# Patient Record
Sex: Female | Born: 1980 | Race: White | Hispanic: No | Marital: Married | Smoking: Never smoker
Health system: Southern US, Community
[De-identification: ages and names within clinical notes are randomized; demographics above are authoritative.]

## PROBLEM LIST (undated history)

## (undated) ENCOUNTER — Inpatient Hospital Stay (HOSPITAL_COMMUNITY): Payer: Self-pay

## (undated) DIAGNOSIS — F419 Anxiety disorder, unspecified: Secondary | ICD-10-CM

## (undated) DIAGNOSIS — D649 Anemia, unspecified: Secondary | ICD-10-CM

## (undated) DIAGNOSIS — Z9851 Tubal ligation status: Secondary | ICD-10-CM

## (undated) DIAGNOSIS — T4145XA Adverse effect of unspecified anesthetic, initial encounter: Secondary | ICD-10-CM

## (undated) DIAGNOSIS — Z98891 History of uterine scar from previous surgery: Secondary | ICD-10-CM

## (undated) DIAGNOSIS — R87629 Unspecified abnormal cytological findings in specimens from vagina: Secondary | ICD-10-CM

## (undated) DIAGNOSIS — T8859XA Other complications of anesthesia, initial encounter: Secondary | ICD-10-CM

## (undated) HISTORY — PX: CERVICAL BIOPSY  W/ LOOP ELECTRODE EXCISION: SUR135

## (undated) HISTORY — PX: THERAPEUTIC ABORTION: SHX798

---

## 2013-02-12 ENCOUNTER — Emergency Department (HOSPITAL_COMMUNITY)
Admission: EM | Admit: 2013-02-12 | Discharge: 2013-02-12 | Disposition: A | Discharge status: Home / Self Care | Payer: Medicaid Other | Attending: Emergency Medicine | Admitting: Emergency Medicine

## 2013-02-12 ENCOUNTER — Emergency Department (HOSPITAL_COMMUNITY): Payer: Medicaid Other

## 2013-02-12 ENCOUNTER — Encounter (HOSPITAL_COMMUNITY): Payer: Self-pay | Admitting: *Deleted

## 2013-02-12 DIAGNOSIS — T1490XA Injury, unspecified, initial encounter: Secondary | ICD-10-CM

## 2013-02-12 DIAGNOSIS — S8990XA Unspecified injury of unspecified lower leg, initial encounter: Secondary | ICD-10-CM | POA: Insufficient documentation

## 2013-02-12 DIAGNOSIS — S96912A Strain of unspecified muscle and tendon at ankle and foot level, left foot, initial encounter: Secondary | ICD-10-CM

## 2013-02-12 DIAGNOSIS — Y9301 Activity, walking, marching and hiking: Secondary | ICD-10-CM | POA: Insufficient documentation

## 2013-02-12 DIAGNOSIS — W108XXA Fall (on) (from) other stairs and steps, initial encounter: Secondary | ICD-10-CM | POA: Insufficient documentation

## 2013-02-12 DIAGNOSIS — Y929 Unspecified place or not applicable: Secondary | ICD-10-CM | POA: Insufficient documentation

## 2013-02-12 DIAGNOSIS — Z880 Allergy status to penicillin: Secondary | ICD-10-CM | POA: Insufficient documentation

## 2013-02-12 DIAGNOSIS — S93609A Unspecified sprain of unspecified foot, initial encounter: Secondary | ICD-10-CM | POA: Insufficient documentation

## 2013-02-12 MED ORDER — KETOROLAC TROMETHAMINE 60 MG/2ML IM SOLN
60.0000 mg | Freq: Once | INTRAMUSCULAR | Status: AC
  Administered 2013-02-12: 60 mg via INTRAMUSCULAR
  Filled 2013-02-12: qty 2

## 2013-02-12 MED ORDER — OXYCODONE-ACETAMINOPHEN 5-325 MG PO TABS: 2.0000 | ORAL_TABLET | Freq: Four times a day (QID) | ORAL | Status: DC | PRN

## 2013-02-12 NOTE — ED Provider Notes (Signed)
CSN: [id number]     Arrival date & time 02/12/13  1036 History   First MD Initiated Contact with Patient 02/12/13 1111     Chief Complaint  Patient presents with  . Foot Injury   (Consider location/radiation/quality/duration/timing/severity/associated sxs/prior Treatment) Patient is a 32 y.o. female presenting with lower extremity pain. The history is provided by the patient. No language interpreter was used.  Foot Pain This is a new problem. The current episode started today. Pertinent negatives include no numbness or weakness. The symptoms are aggravated by standing. She has tried nothing for the symptoms.   Pt is a 32 year old female who presents s/p fall with left foot and toe pain. She reports a recent muscle strain of her lower back. She was walking down wooden stairs outside today when her right leg felt weak, "like it had a cramp". She reports that she fell down 5-7 stairs and is now having left foot pain. She has some soreness in her right leg and foot but is able to bear weight on the right side. She reports her pain to be concentrated locally to her left foot and great toe. She has an abrasion on the top of her foot. She has not tried ice, elevation, OTC meds or any treatments at home.   History reviewed. No pertinent past medical history. Past Surgical History  Procedure Laterality Date  . Cesarean section    . Cervical biopsy  w/ loop electrode excision     No family history on file. History  Substance Use Topics  . Smoking status: Never Smoker   . Smokeless tobacco: Not on file  . Alcohol Use: No   OB History   Grav Para Term Preterm Abortions TAB SAB Ect Mult Living                 Review of Systems  Musculoskeletal:       Left foot and great toe pain. Abrasion on dorsum of foot.   Neurological: Negative for weakness and numbness.  All other systems reviewed and are negative.    Allergies  Amoxicillin; Erythromycin; and Penicillins  Home Medications  No  current outpatient prescriptions on file. BP 115/74  Pulse 70  Temp(Src) 98.1 F (36.7 C) (Oral)  Resp 18  Ht 5\' 1"  (1.549 m)  Wt 163 lb (73.936 kg)  BMI 30.81 kg/m2  SpO2 99%  LMP 02/09/2013 Physical Exam  Vitals reviewed. Constitutional: She is oriented to person, place, and time. She appears well-developed and well-nourished. No distress.  HENT:  Head: Normocephalic and atraumatic.  Eyes: Pupils are equal, round, and reactive to light.  Neck: Normal range of motion.  Cardiovascular: Normal rate, regular rhythm, normal heart sounds and intact distal pulses.   Pulmonary/Chest: Effort normal and breath sounds normal.  Musculoskeletal:       Left ankle: Normal.       Feet:  Left foot and great toe pain. Swelling to left great toe and limited ROM. Abrasion to dorsum of foot. Ankle stable, without swelling. Good sensation distally, brisk capillary refill, no numbness or tingling.   Neurological: She is alert and oriented to person, place, and time.  Skin: Skin is warm and dry.  Psychiatric: She has a normal mood and affect. Her behavior is normal. Judgment and thought content normal.    ED Course  Procedures (including critical care time) Labs Review Labs Reviewed - No data to display Imaging Review No results found.  MDM   1. Strain of  foot, left, initial encounter   2. Soft tissue injury     X-ray results, dorsal soft tissue swelling. No acute fracture. Good sensation and strength, limited ROM due to pain and swelling. No neurovascular compromise. 2+ distal pulses, brisk capillary refill. RICE instructions given.     Irish Elders, NP 02/12/13 1327

## 2013-02-12 NOTE — Progress Notes (Signed)
Orthopedic Tech Progress Note Patient Details:  Annette Hutchinson [birth year] [id number]  Ortho Devices Type of Ortho Device: Ace wrap;Crutches Ortho Device/Splint Location: Left LE Ortho Device/Splint Interventions: Application   Asia R Thompson 02/12/2013, 2:08 PM

## 2013-02-12 NOTE — ED Notes (Signed)
Pt's R leg buckled and she fell down stairs.  Denies loc and is not on anticoagulants.  C/o L foot pain, esp L great toe. Also c/o pain to R foot, though minimal.  Pt denies abuse.

## 2013-02-14 NOTE — ED Provider Notes (Signed)
Medical screening examination/treatment/procedure(s) were performed by non-physician practitioner and as supervising physician I was immediately available for consultation/collaboration.    Roddie Riegler David Lonza Shimabukuro, MD 02/14/13 0924 

## 2013-03-26 ENCOUNTER — Emergency Department (HOSPITAL_COMMUNITY): Payer: Medicaid Other

## 2013-03-26 ENCOUNTER — Encounter (HOSPITAL_COMMUNITY): Payer: Self-pay | Admitting: Emergency Medicine

## 2013-03-26 ENCOUNTER — Emergency Department (HOSPITAL_COMMUNITY)
Admission: EM | Admit: 2013-03-26 | Discharge: 2013-03-26 | Disposition: A | Discharge status: Home / Self Care | Payer: Medicaid Other | Attending: Emergency Medicine | Admitting: Emergency Medicine

## 2013-03-26 DIAGNOSIS — M79609 Pain in unspecified limb: Secondary | ICD-10-CM

## 2013-03-26 DIAGNOSIS — R0789 Other chest pain: Secondary | ICD-10-CM | POA: Insufficient documentation

## 2013-03-26 DIAGNOSIS — M79604 Pain in right leg: Secondary | ICD-10-CM

## 2013-03-26 DIAGNOSIS — Z880 Allergy status to penicillin: Secondary | ICD-10-CM | POA: Insufficient documentation

## 2013-03-26 DIAGNOSIS — R079 Chest pain, unspecified: Secondary | ICD-10-CM

## 2013-03-26 LAB — CBC
HCT: 39.4 % (ref 36.0–46.0)
Hemoglobin: 13.8 g/dL (ref 12.0–15.0)
MCH: 33 pg (ref 26.0–34.0)
MCHC: 35 g/dL (ref 30.0–36.0)
Platelets: 280 10*3/uL (ref 150–400)

## 2013-03-26 LAB — COMPREHENSIVE METABOLIC PANEL
ALT: 15 U/L (ref 0–35)
BUN: 10 mg/dL (ref 6–23)
Calcium: 9.4 mg/dL (ref 8.4–10.5)
GFR calc Af Amer: >90 mL/min (ref >90)
Glucose, Bld: 87 mg/dL (ref 70–99)
Sodium: 138 mEq/L (ref 135–145)
Total Protein: 7.6 g/dL (ref 6.0–8.3)

## 2013-03-26 LAB — POCT I-STAT TROPONIN I: Troponin i, poc: 0 ng/mL (ref 0.00–0.08)

## 2013-03-26 NOTE — Progress Notes (Signed)
VASCULAR LAB PRELIMINARY  PRELIMINARY  PRELIMINARY  PRELIMINARY  Right lower extremity venous duplex completed.    Preliminary report:  Right:  No evidence of DVT, superficial thrombosis, or Baker's cyst.  Deo Mehringer, RVT 03/26/2013, 6:14 PM

## 2013-03-26 NOTE — ED Notes (Signed)
Chest burning started last week  Comes and goes pain came back yesterday and then it came back today and she is weak in her arms  Fell down flight off stairs ~5 weeks ago ?  Any relation

## 2013-03-26 NOTE — ED Provider Notes (Signed)
CSN: [id number]     Arrival date & time 03/26/13  1429 History   First MD Initiated Contact with Patient 03/26/13 1648     Chief Complaint  Patient presents with  . Chest Pain   (Consider location/radiation/quality/duration/timing/severity/associated sxs/prior Treatment) HPI Comments: Arien Morine is a 32 y.o. Female who presents for evaluation of chest discomfort. The symptoms have been on and off for several days. She has had 3 episodes. The discomfort is both a "hot and cold feeling. Today, it started insidiously, at 10 AM and has persisted without stopping. Today, the discomfort waxes and wanes between 3 and 8/10. She does not know of any aggravating or alleviating factors. She has not taken anything for the discomfort. She wonders if it "is my stress". Her husband works out of town and frequently is away during the week. Her young child is changing daycare facilities this week. She has also had right leg pain for 3 months that she feels is due to a muscle strain at the gym. She has been attempting to stretch it out, but cannot eliminate pain. The pain started in her right low back and has now migrated to her right posterior thigh and right lateral knee. She denies swelling of the legs. She denies shortness of breath, diaphoresis, nausea, vomiting, weakness, or dizziness. There are no other known modifying factors.  Patient is a 32 y.o. female presenting with chest pain. The history is provided by the patient.  Chest Pain   History reviewed. No pertinent past medical history. Past Surgical History  Procedure Laterality Date  . Cesarean section    . Cervical biopsy  w/ loop electrode excision     No family history on file. History  Substance Use Topics  . Smoking status: Never Smoker   . Smokeless tobacco: Not on file  . Alcohol Use: No   OB History   Grav Para Term Preterm Abortions TAB SAB Ect Mult Living                 Review of Systems  Cardiovascular: Positive for chest  pain.  All other systems reviewed and are negative.    Allergies  Amoxicillin; Erythromycin; and Penicillins  Home Medications  No current outpatient prescriptions on file. BP 108/60  Pulse 66  Temp(Src) 97.9 F (36.6 C)  Resp 20  Wt 166 lb (75.297 kg)  SpO2 100%  LMP 03/14/2013 Physical Exam  Nursing note and vitals reviewed. Constitutional: She is oriented to person, place, and time. She appears well-developed and well-nourished.  HENT:  Head: Normocephalic and atraumatic.  Eyes: Conjunctivae and EOM are normal. Pupils are equal, round, and reactive to light.  Neck: Normal range of motion and phonation normal. Neck supple.  Cardiovascular: Normal rate, regular rhythm and intact distal pulses.   Pulmonary/Chest: Effort normal and breath sounds normal. She exhibits no tenderness.  Abdominal: Soft. She exhibits no distension. There is no tenderness. There is no guarding.  Musculoskeletal: Normal range of motion.  Right leg, diffusely tender in posterior calf, and right lateral knee without deformity, or abnormal range of motion. Back is without tenderness in the lumbar area and she demonstrates normal range of motion of the lumbar spine. She is neurovascular intact distally in the right foot.  Neurological: She is alert and oriented to person, place, and time. She exhibits normal muscle tone.  Skin: Skin is warm and dry.  Psychiatric: Her behavior is normal. Judgment and thought content normal.  Anxious. She frequently scolds her young  child, who is in the room with her, in an aggressive fashion.    ED Course  Procedures (including critical care time)  Duplex ordered it to evaluate for DVT. Study is negative.  Will not image for PE, she is PERC negative.     Labs Review Labs Reviewed  CBC  COMPREHENSIVE METABOLIC PANEL  POCT I-STAT TROPONIN I   Imaging Review Dg Chest 2 View  03/26/2013   CLINICAL DATA:  Chest pain  EXAM: CHEST  2 VIEW  COMPARISON:  None.   FINDINGS: The lungs are clear. Heart size and pulmonary vascularity are normal. No adenopathy. No pneumothorax. No bone lesions. There is mild lower thoracic levoscoliosis.  IMPRESSION: No edema or consolidation.   Electronically Signed   By: Bretta Bang M.D.   On: 03/26/2013 15:42    EKG Interpretation     Ventricular Rate:  88 PR Interval:  118 QRS Duration: 86 QT Interval:  358 QTC Calculation: 433 R Axis:   77 Text Interpretation:  Normal sinus rhythm Nonspecific T wave abnormality Abnormal ECG No old tracing to compare            MDM   1. Chest pain, unspecified   2. Leg pain, right    Nonspecific chest pain is very atypical for coronary artery disease in a very low risk patient. This most likely represents stress related musculoskeletal pain. There is no evidence for coronary, pulmonary, metabolic or infectious processes. Unassociated back and leg pain are likely from a muscular or possibly sciatica source. She is stable for discharge with outpatient management.   Nursing Notes Reviewed/ Care Coordinated, and agree without changes. Applicable Imaging Reviewed.  Interpretation of Laboratory Data incorporated into ED treatment   Plan: Home Medications- Advil prn; Home Treatments and Observation- Heat, stress management; return here if the recommended treatment, does not improve the symptoms; Recommended follow up- PCP or therapist prn      Flint Melter, MD 03/26/13 [phone]

## 2013-03-26 NOTE — ED Notes (Signed)
Called for room, no answer

## 2014-02-17 ENCOUNTER — Inpatient Hospital Stay (HOSPITAL_COMMUNITY): Payer: 59

## 2014-02-17 ENCOUNTER — Inpatient Hospital Stay (HOSPITAL_COMMUNITY)
Admission: AD | Admit: 2014-02-17 | Discharge: 2014-02-17 | Disposition: A | Discharge status: Home / Self Care | Payer: 59 | Source: Ambulatory Visit | Attending: Obstetrics and Gynecology | Admitting: Obstetrics and Gynecology

## 2014-02-17 ENCOUNTER — Encounter (HOSPITAL_COMMUNITY): Payer: Self-pay | Admitting: *Deleted

## 2014-02-17 DIAGNOSIS — O209 Hemorrhage in early pregnancy, unspecified: Secondary | ICD-10-CM | POA: Insufficient documentation

## 2014-02-17 DIAGNOSIS — Z880 Allergy status to penicillin: Secondary | ICD-10-CM | POA: Diagnosis not present

## 2014-02-17 DIAGNOSIS — R109 Unspecified abdominal pain: Secondary | ICD-10-CM | POA: Diagnosis present

## 2014-02-17 LAB — CBC WITH DIFFERENTIAL/PLATELET
BASOS ABS: 0 10*3/uL (ref 0.0–0.1)
BASOS PCT: 0 % (ref 0–1)
Eosinophils Absolute: 0.2 10*3/uL (ref 0.0–0.7)
Eosinophils Relative: 2 % (ref 0–5)
HCT: 37.4 % (ref 36.0–46.0)
HEMOGLOBIN: 13.3 g/dL (ref 12.0–15.0)
Lymphocytes Relative: 29 % (ref 12–46)
Lymphs Abs: 1.9 10*3/uL (ref 0.7–4.0)
MCH: 32.9 pg (ref 26.0–34.0)
MCHC: 35.6 g/dL (ref 30.0–36.0)
MCV: 92.6 fL (ref 78.0–100.0)
Monocytes Absolute: 0.4 10*3/uL (ref 0.1–1.0)
Monocytes Relative: 6 % (ref 3–12)
Neutro Abs: 4.1 10*3/uL (ref 1.7–7.7)
Neutrophils Relative %: 63 % (ref 43–77)
PLATELETS: 290 10*3/uL (ref 150–400)
RBC: 4.04 MIL/uL (ref 3.87–5.11)
RDW: 12.6 % (ref 11.5–15.5)
WBC: 6.6 10*3/uL (ref 4.0–10.5)

## 2014-02-17 LAB — URINE MICROSCOPIC-ADD ON

## 2014-02-17 LAB — URINALYSIS, ROUTINE W REFLEX MICROSCOPIC
BILIRUBIN URINE: NEGATIVE
Glucose, UA: NEGATIVE mg/dL
Ketones, ur: NEGATIVE mg/dL
Leukocytes, UA: NEGATIVE
Nitrite: NEGATIVE
PH: 7.5 (ref 5.0–8.0)
Protein, ur: NEGATIVE mg/dL
Specific Gravity, Urine: 1.015 (ref 1.005–1.030)
Urobilinogen, UA: 0.2 mg/dL (ref 0.0–1.0)

## 2014-02-17 LAB — HIV ANTIBODY (ROUTINE TESTING W REFLEX): HIV 1&2 Ab, 4th Generation: NONREACTIVE

## 2014-02-17 LAB — ABO/RH: ABO/RH(D): O NEG

## 2014-02-17 LAB — WET PREP, GENITAL
CLUE CELLS WET PREP: NONE SEEN
Trich, Wet Prep: NONE SEEN
Yeast Wet Prep HPF POC: NONE SEEN

## 2014-02-17 LAB — HCG, QUANTITATIVE, PREGNANCY: hCG, Beta Chain, Quant, S: 402 m[IU]/mL — ABNORMAL HIGH (ref <5)

## 2014-02-17 LAB — POCT PREGNANCY, URINE: Preg Test, Ur: POSITIVE — AB

## 2014-02-17 LAB — RPR

## 2014-02-17 MED ORDER — ACETAMINOPHEN 325 MG PO TABS
650.0000 mg | ORAL_TABLET | Freq: Once | ORAL | Status: AC
  Administered 2014-02-17: 650 mg via ORAL
  Filled 2014-02-17: qty 2

## 2014-02-17 NOTE — MAU Note (Signed)
Pt states that she has had + HPT on Tuesday (5 days ago). She c/o cramping that started this morning, one hour ago she went to restroom and had a dime size dark brown blood clot and still continues to have dark brown spotting when wiping (x1hr).

## 2014-02-17 NOTE — MAU Provider Note (Signed)
History     CSN: [id number]  Arrival date and time: 02/17/14 1453   First Provider Initiated Contact with Patient 02/17/14 1536      Chief Complaint  Patient presents with  . Possible Pregnancy   HPI Comments: Annette Hutchinson 33 y.o. [id number] [redacted]w[redacted]d presents to MAU today with cramping and spotting of dark brown blood since this morning. Her pain is 2/10 and she has taken no medications.    Possible Pregnancy Associated symptoms include abdominal pain. Pertinent negatives include no nausea or vomiting.      History reviewed. No pertinent past medical history.  Past Surgical History  Procedure Laterality Date  . Cesarean section    . Cervical biopsy  w/ loop electrode excision      History reviewed. No pertinent family history.  History  Substance Use Topics  . Smoking status: Never Smoker   . Smokeless tobacco: Not on file  . Alcohol Use: No    Allergies:  Allergies  Allergen Reactions  . Amoxicillin Rash  . Erythromycin Rash  . Penicillins Rash    No prescriptions prior to admission    Review of Systems  Constitutional: Negative.   HENT: Negative.   Eyes: Negative.   Respiratory: Negative.   Cardiovascular: Negative.   Gastrointestinal: Positive for abdominal pain. Negative for nausea and vomiting.  Genitourinary:       Vaginal bleeding  Musculoskeletal: Negative.   Skin: Negative.   Neurological: Negative.   Psychiatric/Behavioral: Negative.    Physical Exam   Blood pressure 124/57, pulse 84, temperature 98.3 F (36.8 C), temperature source Oral, resp. rate 18, height  (1.575 m), weight 78.472 kg (173 lb), last menstrual period 01/14/2014.  Physical Exam  Constitutional: She is oriented to person, place, and time. She appears well-developed and well-nourished. No distress.  HENT:  Head: Normocephalic and atraumatic.  GI: Soft. Bowel sounds are normal. She exhibits no distension. There is no tenderness. There is no rebound and no guarding.   Genitourinary:  Genital:External negative Vaginal:small amount brown blood Cervix:closed/ thick Bimanual:nontender   Musculoskeletal: Normal range of motion.  Neurological: She is alert and oriented to person, place, and time.  Skin: Skin is warm and dry. She is not diaphoretic.  Psychiatric: She has a normal mood and affect. Her behavior is normal. Judgment and thought content normal.   Results for orders placed during the hospital encounter of 02/17/14 (from the past 24 hour(s))  URINALYSIS, ROUTINE W REFLEX MICROSCOPIC     Status: Abnormal   Collection Time    02/17/14  3:00 PM      Result Value Ref Range   Color, Urine YELLOW  YELLOW   APPearance CLEAR  CLEAR   Specific Gravity, Urine 1.015  1.005 - 1.030   pH 7.5  5.0 - 8.0   Glucose, UA NEGATIVE  NEGATIVE mg/dL   Hgb urine dipstick MODERATE (*) NEGATIVE   Bilirubin Urine NEGATIVE  NEGATIVE   Ketones, ur NEGATIVE  NEGATIVE mg/dL   Protein, ur NEGATIVE  NEGATIVE mg/dL   Urobilinogen, UA 0.2  0.0 - 1.0 mg/dL   Nitrite NEGATIVE  NEGATIVE   Leukocytes, UA NEGATIVE  NEGATIVE  URINE MICROSCOPIC-ADD ON     Status: None   Collection Time    02/17/14  3:00 PM      Result Value Ref Range   Squamous Epithelial / LPF RARE  RARE   WBC, UA 0-2  <3 WBC/hpf   RBC / HPF 3-6  <3 RBC/hpf  POCT  PREGNANCY, URINE     Status: Abnormal   Collection Time    02/17/14  3:08 PM      Result Value Ref Range   Preg Test, Ur POSITIVE (*) NEGATIVE  CBC WITH DIFFERENTIAL     Status: None   Collection Time    02/17/14  3:25 PM      Result Value Ref Range   WBC 6.6  4.0 - 10.5 K/uL   RBC 4.04  3.87 - 5.11 MIL/uL   Hemoglobin 13.3  12.0 - 15.0 g/dL   HCT 62.9  52.8 - 41.3 %   MCV 92.6  78.0 - 100.0 fL   MCH 32.9  26.0 - 34.0 pg   MCHC 35.6  30.0 - 36.0 g/dL   RDW 24.4  01.0 - 27.2 %   Platelets 290  150 - 400 K/uL   Neutrophils Relative % 63  43 - 77 %   Neutro Abs 4.1  1.7 - 7.7 K/uL   Lymphocytes Relative 29  12 - 46 %   Lymphs Abs 1.9   0.7 - 4.0 K/uL   Monocytes Relative 6  3 - 12 %   Monocytes Absolute 0.4  0.1 - 1.0 K/uL   Eosinophils Relative 2  0 - 5 %   Eosinophils Absolute 0.2  0.0 - 0.7 K/uL   Basophils Relative 0  0 - 1 %   Basophils Absolute 0.0  0.0 - 0.1 K/uL  HCG, QUANTITATIVE, PREGNANCY     Status: Abnormal   Collection Time    02/17/14  3:25 PM      Result Value Ref Range   hCG, Beta Chain, Quant, S 402 (*) <5 mIU/mL  ABO/RH     Status: None   Collection Time    02/17/14  3:25 PM      Result Value Ref Range   ABO/RH(D) O NEG    WET PREP, GENITAL     Status: Abnormal   Collection Time    02/17/14  3:45 PM      Result Value Ref Range   Yeast Wet Prep HPF POC NONE SEEN  NONE SEEN   Trich, Wet Prep NONE SEEN  NONE SEEN   Clue Cells Wet Prep HPF POC NONE SEEN  NONE SEEN   WBC, Wet Prep HPF POC MODERATE (*) NONE SEEN   US Ob Comp Less 14 Wks  02/17/2014   CLINICAL DATA:  Spotting and cramping today. Quantitative beta HCG is 402. Prior C-section x4. TAB x1. LMP 01/14/2014. GA by LMP is 4 weeks 6 days. EDC by LMP is 10/21/2014.  EXAM: OBSTETRIC <14 WK [place] AND TRANSVAGINAL OB US  TECHNIQUE: Both transabdominal and transvaginal ultrasound examinations were performed for complete evaluation of the gestation as well as the maternal uterus, adnexal regions, and pelvic cul-de-sac. Transvaginal technique was performed to assess early pregnancy.  COMPARISON:  None.  FINDINGS: Intrauterine gestational sac: A small irregular fluid collection is identified within the thickened endometrium with possible decidual reaction, likely representing intrauterine gestational sac.  Yolk sac:  None  Embryo:  None  Cardiac Activity: None  MSD:  4.9  mm   5 w   2  d  Maternal uterus/adnexae: No subchorionic hemorrhage identified. Right ovarian simple cyst is 3.9 cm. The left ovary has a normal appearance. No free pelvic fluid.  IMPRESSION: 1. Small fluid collection within the thickened endometrium, likely representing gestational sac. 2.  Follow-up ultrasound is recommended in 14 days to document presence of fetal  pole and for dating purposes.   Electronically Signed   By: Rosalie Gums M.D.   On: 02/17/2014 17:41   US Ob Transvaginal  02/17/2014   CLINICAL DATA:  Spotting and cramping today. Quantitative beta HCG is 402. Prior C-section x4. TAB x1. LMP 01/14/2014. GA by LMP is 4 weeks 6 days. EDC by LMP is 10/21/2014.  EXAM: OBSTETRIC <14 WK [place] AND TRANSVAGINAL OB US  TECHNIQUE: Both transabdominal and transvaginal ultrasound examinations were performed for complete evaluation of the gestation as well as the maternal uterus, adnexal regions, and pelvic cul-de-sac. Transvaginal technique was performed to assess early pregnancy.  COMPARISON:  None.  FINDINGS: Intrauterine gestational sac: A small irregular fluid collection is identified within the thickened endometrium with possible decidual reaction, likely representing intrauterine gestational sac.  Yolk sac:  None  Embryo:  None  Cardiac Activity: None  MSD:  4.9  mm   5 w   2  d  Maternal uterus/adnexae: No subchorionic hemorrhage identified. Right ovarian simple cyst is 3.9 cm. The left ovary has a normal appearance. No free pelvic fluid.  IMPRESSION: 1. Small fluid collection within the thickened endometrium, likely representing gestational sac. 2. Follow-up ultrasound is recommended in 14 days to document presence of fetal pole and for dating purposes.   Electronically Signed   By: Rosalie Gums M.D.   On: 02/17/2014 17:41    MAU Course  Procedures  MDM Wet prep, GC, Chlamydia, CBC, UA, U/S, ABORh, Quant Tylenol for pain  Dr Ellyn Hack called and asked the patient call office Monday and quant will be repeated on Tuesday and ultrasound will be repeated within 14 days  Assessment and Plan   A: Bleeding in early pregnancy  P: Above orders Advised to Call office tomorrow for repeat BHCG on Tuesday and repeat U/S in 14 days Pelvic rest/ fluids Return to MAU as needed  Annette Hutchinson 02/17/2014, 3:49 PM

## 2014-02-17 NOTE — Discharge Instructions (Signed)
Threatened Miscarriage °A threatened miscarriage occurs when you have vaginal bleeding during your first 20 weeks of pregnancy but the pregnancy has not ended. If you have vaginal bleeding during this time, your health care provider will do tests to make sure you are still pregnant. If the tests show you are still pregnant and the developing baby (fetus) inside your womb (uterus) is still growing, your condition is considered a threatened miscarriage. °A threatened miscarriage does not mean your pregnancy will end, but it does increase the risk of losing your pregnancy (complete miscarriage). °CAUSES  °The cause of a threatened miscarriage is usually not known. If you go on to have a complete miscarriage, the most common cause is an abnormal number of chromosomes in the developing baby. Chromosomes are the structures inside cells that hold all your genetic material. °Some causes of vaginal bleeding that do not result in miscarriage include: °· Having sex. °· Having an infection. °· Normal hormone changes of pregnancy. °· Bleeding that occurs when an egg implants in your uterus. °RISK FACTORS °Risk factors for bleeding in early pregnancy include: °· Obesity. °· Smoking. °· Drinking excessive amounts of alcohol or caffeine. °· Recreational drug use. °SIGNS AND SYMPTOMS °· Light vaginal bleeding. °· Mild abdominal pain or cramps. °DIAGNOSIS  °If you have bleeding with or without abdominal pain before 20 weeks of pregnancy, your health care provider will do tests to check whether you are still pregnant. One important test involves using sound waves and a computer (ultrasound) to create images of the inside of your uterus. Other tests include an internal exam of your vagina and uterus (pelvic exam) and measurement of your baby's heart rate.  °You may be diagnosed with a threatened miscarriage if: °· Ultrasound testing shows you are still pregnant. °· Your baby's heart rate is strong. °· A pelvic exam shows that the  opening between your uterus and your vagina (cervix) is closed. °· Your heart rate and blood pressure are stable. °· Blood tests confirm you are still pregnant. °TREATMENT  °No treatments have been shown to prevent a threatened miscarriage from going on to a complete miscarriage. However, the right home care is important.  °HOME CARE INSTRUCTIONS  °· Make sure you keep all your appointments for prenatal care. This is very important. °· Get plenty of rest. °· Do not have sex or use tampons if you have vaginal bleeding. °· Do not douche. °· Do not smoke or use recreational drugs. °· Do not drink alcohol. °· Avoid caffeine. °SEEK MEDICAL CARE IF: °· You have light vaginal bleeding or spotting while pregnant. °· You have abdominal pain or cramping. °· You have a fever. °SEEK IMMEDIATE MEDICAL CARE IF: °· You have heavy vaginal bleeding. °· You have blood clots coming from your vagina. °· You have severe low back pain or abdominal cramps. °· You have fever, chills, and severe abdominal pain. °MAKE SURE YOU: °· Understand these instructions. °· Will watch your condition. °· Will get help right away if you are not doing well or get worse. °Document Released: 05/10/2005 Document Revised: 05/15/2013 Document Reviewed: 03/06/2013 °ExitCare® Patient Information ©2015 [employer]. This information is not intended to replace advice given to you by your health care provider. Make sure you discuss any questions you have with your health care provider. ° °Vaginal Bleeding During Pregnancy, First Trimester °A small amount of bleeding (spotting) from the vagina is relatively common in early pregnancy. It usually stops on its own. Various things may cause bleeding   or spotting in early pregnancy. Some bleeding may be related to the pregnancy, and some may not. In most cases, the bleeding is normal and is not a problem. However, bleeding can also be a sign of something serious. Be sure to tell your health care provider about any  vaginal bleeding right away. °Some possible causes of vaginal bleeding during the first trimester include: °· Infection or inflammation of the cervix. °· Growths (polyps) on the cervix. °· Miscarriage or threatened miscarriage. °· Pregnancy tissue has developed outside of the uterus and in a fallopian tube (tubal pregnancy). °· Tiny cysts have developed in the uterus instead of pregnancy tissue (molar pregnancy). °HOME CARE INSTRUCTIONS  °Watch your condition for any changes. The following actions may help to lessen any discomfort you are feeling: °· Follow your health care provider's instructions for limiting your activity. If your health care provider orders bed rest, you may need to stay in bed and only get up to use the bathroom. However, your health care provider may allow you to continue light activity. °· If needed, make plans for someone to help with your regular activities and responsibilities while you are on bed rest. °· Keep track of the number of pads you use each day, how often you change pads, and how soaked (saturated) they are. Write this down. °· Do not use tampons. Do not douche. °· Do not have sexual intercourse or orgasms until approved by your health care provider. °· If you pass any tissue from your vagina, save the tissue so you can show it to your health care provider. °· Only take over-the-counter or prescription medicines as directed by your health care provider. °· Do not take aspirin because it can make you bleed. °· Keep all follow-up appointments as directed by your health care provider. °SEEK MEDICAL CARE IF: °· You have any vaginal bleeding during any part of your pregnancy. °· You have cramps or labor pains. °· You have a fever, not controlled by medicine. °SEEK IMMEDIATE MEDICAL CARE IF:  °· You have severe cramps in your back or belly (abdomen). °· You pass large clots or tissue from your vagina. °· Your bleeding increases. °· You feel light-headed or weak, or you have fainting  episodes. °· You have chills. °· You are leaking fluid or have a gush of fluid from your vagina. °· You pass out while having a bowel movement. °MAKE SURE YOU: °· Understand these instructions. °· Will watch your condition. °· Will get help right away if you are not doing well or get worse. °Document Released: 02/17/2005 Document Revised: 05/15/2013 Document Reviewed: 01/15/2013 °ExitCare® Patient Information ©2015 [employer]. This information is not intended to replace advice given to you by your health care provider. Make sure you discuss any questions you have with your health care provider. ° °

## 2014-02-18 LAB — GC/CHLAMYDIA PROBE AMP
CT PROBE, AMP APTIMA: NEGATIVE
GC Probe RNA: NEGATIVE

## 2014-03-03 ENCOUNTER — Inpatient Hospital Stay (HOSPITAL_COMMUNITY)
Admission: AD | Admit: 2014-03-03 | Discharge: 2014-03-04 | Disposition: A | Discharge status: Home / Self Care | Payer: 59 | Source: Ambulatory Visit | Attending: Obstetrics and Gynecology | Admitting: Obstetrics and Gynecology

## 2014-03-03 DIAGNOSIS — O468X1 Other antepartum hemorrhage, first trimester: Secondary | ICD-10-CM

## 2014-03-03 DIAGNOSIS — O418X10 Other specified disorders of amniotic fluid and membranes, first trimester, not applicable or unspecified: Secondary | ICD-10-CM

## 2014-03-03 DIAGNOSIS — O208 Other hemorrhage in early pregnancy: Secondary | ICD-10-CM | POA: Insufficient documentation

## 2014-03-03 DIAGNOSIS — Z3A01 Less than 8 weeks gestation of pregnancy: Secondary | ICD-10-CM | POA: Insufficient documentation

## 2014-03-04 ENCOUNTER — Encounter (HOSPITAL_COMMUNITY): Payer: Self-pay

## 2014-03-04 ENCOUNTER — Inpatient Hospital Stay (HOSPITAL_COMMUNITY): Payer: 59

## 2014-03-04 DIAGNOSIS — O208 Other hemorrhage in early pregnancy: Secondary | ICD-10-CM | POA: Diagnosis not present

## 2014-03-04 DIAGNOSIS — Z3A01 Less than 8 weeks gestation of pregnancy: Secondary | ICD-10-CM | POA: Diagnosis not present

## 2014-03-04 DIAGNOSIS — O43891 Other placental disorders, first trimester: Secondary | ICD-10-CM

## 2014-03-04 DIAGNOSIS — O209 Hemorrhage in early pregnancy, unspecified: Secondary | ICD-10-CM | POA: Diagnosis present

## 2014-03-04 MED ORDER — RHO D IMMUNE GLOBULIN 1500 UNIT/2ML IJ SOSY
300.0000 ug | PREFILLED_SYRINGE | Freq: Once | INTRAMUSCULAR | Status: AC
  Administered 2014-03-04: 300 ug via INTRAMUSCULAR
  Filled 2014-03-04: qty 2

## 2014-03-04 NOTE — MAU Note (Signed)
Bright red vaginal bleeding x 30 minutes with some abdominal cramping.

## 2014-03-04 NOTE — Discharge Instructions (Signed)

## 2014-03-04 NOTE — MAU Provider Note (Signed)
History     CSN: [id number]  Arrival date and time: 03/03/14 2351   First Provider Initiated Contact with Patient 03/04/14 0012      Chief Complaint  Patient presents with  . Vaginal Bleeding  . Abdominal Cramping   HPI [name] is a 33 y.o. [phone] at 3620w0d who presents to MAU today with complaint of vaginal bleeding. She states that bleeding started tonight. She is also having associate mild to moderate lower abdomina cramping that comes and goes. Patient was seen in MAU on 02/17/14 and had US that showed probably IUGS without YS or FP. [facility] labs and states that she did and follow-up US was scheduled for tomorrow, however that US was re-scheduled to 03/13/14 on Friday per patient. The patient states that she had scant brown bleeding when seen on 02/17/14, but otherwise no bleeding until tonight. Tonight she states small amount of bright red blood with wiping and in her underwear. She denies fever. She has had N/V recently and is taking Diclegis.    OB History   Grav Para Term Preterm Abortions TAB SAB Ect Mult Living   6 4 2 2 1 1    3       Past Medical History  Diagnosis Date  . Preterm labor     Past Surgical History  Procedure Laterality Date  . Cesarean section    . Cervical biopsy  w/ loop electrode excision      History reviewed. No pertinent family history.  History  Substance Use Topics  . Smoking status: Never Smoker   . Smokeless tobacco: Not on file  . Alcohol Use: No    Allergies:  Allergies  Allergen Reactions  . Amoxicillin Rash  . Erythromycin Rash  . Penicillins Rash    Prescriptions prior to admission  Medication Sig Dispense Refill  . Doxylamine-Pyridoxine (DICLEGIS PO) Take 1 tablet by mouth.        Review of Systems  Constitutional: Negative for fever and malaise/fatigue.  Gastrointestinal: Positive for nausea, vomiting and abdominal pain.  Genitourinary:     + vaginal bleeding  Neurological: Negative for dizziness and loss of consciousness.   Physical Exam   Blood pressure 122/67, pulse 79, temperature 98.6 F (37 C), temperature source Oral, resp. rate 20, height 5\' 2"  (1.575 m), weight 174 lb 6.4 oz (79.107 kg), last menstrual period 01/14/2014.  Physical Exam  Constitutional: She is oriented to person, place, and time. She appears well-developed and well-nourished. No distress.  HENT:  Head: Normocephalic.  Cardiovascular: Normal rate.   Respiratory: Effort normal.  GI: Soft. She exhibits no distension and no mass. There is tenderness (mild tenderness to palpation of the lower abdomen). There is no rebound and no guarding.  Genitourinary: Uterus is enlarged (slightly, exam limited by maternal body habitus). Uterus is not tender. Cervix exhibits no motion tenderness, no discharge and no friability. Right adnexum displays tenderness (mild). Right adnexum displays no mass. Left adnexum displays no mass and no tenderness. There is bleeding (scant brown blood in the vaginal vault) around the vagina. No vaginal discharge found.  Neurological: She is alert and oriented to person, place, and time.  Skin: Skin is warm and dry. No erythema.  Psychiatric: She has a normal mood and affect.   Results for orders placed during the hospital encounter of 03/03/14 (from the past 24 hour(s))  RH IG WORKUP (INCLUDES ABO/RH)     Status: None  Collection Time    03/04/14  1:00 AM      Result Value Ref Range   Gestational Age(Wks) 7     ABO/RH(D) O NEG     Antibody Screen PENDING      [place] Ob Transvaginal  03/04/2014   CLINICAL DATA:  Vaginal bleeding and pregnancy  EXAM: TRANSVAGINAL OB ULTRASOUND  TECHNIQUE: Transvaginal ultrasound was performed for complete evaluation of the gestation as well as the maternal uterus, adnexal regions, and pelvic cul-de-sac.  COMPARISON:  02/17/2014  FINDINGS: Intrauterine gestational sac: Visualized/normal in shape.  Yolk  sac:  Present  Embryo:  Present  Cardiac Activity: Present  Heart Rate: 111 bpm  CRL:   3.9  mm   6 w 1 d                  US EDC: 10/27/2014  Maternal uterus/adnexae: There is a small subchronic hemorrhage measuring 11 x 5 x 8 mm.  Simple appearing cyst noted in the right ovary measuring up to 4.8 cm. Left ovary not visualized. No adnexal mass.  IMPRESSION: 1. Single, living intrauterine gestation with estimated age of 6 weeks 1 day. 2. Small subchronic hemorrhage. 3. 4.8 cm right ovarian cyst.   Electronically Signed   By: Tiburcio PeaJonathan  Watts M.D.   On: 03/04/2014 01:02    MAU Course  Procedures None  MDM US today since patient does not have confirmed IUP and new onset vaginal bleeding Discussed patient with Dr. Senaida Oresichardson. Ok for discharge and follow-up as scheduled.  Rhogam given.  Assessment and Plan  A: SIUP at 6077w1d Small subchorionic hemorrhage Vaginal bleeding in pregnancy prior to [redacted] weeks gestation Rh negative  P: Discharge home Bleeding precautions discussed Rhogam given today Patient [facility] for routine prenatal care Patient may return to MAU as needed or if her condition were to change or worsen   Marny LowensteinJulie N Hakeen Shipes, PA-C  03/04/2014, 2:06 AM

## 2014-03-05 LAB — RH IG WORKUP (INCLUDES ABO/RH)
ABO/RH(D): O NEG
ANTIBODY SCREEN: NEGATIVE
Gestational Age(Wks): 7
Unit division: 0

## 2014-03-13 LAB — OB RESULTS CONSOLE RUBELLA ANTIBODY, IGM: Rubella: NON-IMMUNE/NOT IMMUNE

## 2014-03-13 LAB — OB RESULTS CONSOLE HEPATITIS B SURFACE ANTIGEN: HEP B S AG: NEGATIVE

## 2014-03-25 ENCOUNTER — Encounter (HOSPITAL_COMMUNITY): Payer: Self-pay

## 2014-06-05 ENCOUNTER — Encounter (HOSPITAL_COMMUNITY): Payer: Self-pay | Admitting: General Practice

## 2014-06-05 ENCOUNTER — Inpatient Hospital Stay (HOSPITAL_COMMUNITY)
Admission: AD | Admit: 2014-06-05 | Discharge: 2014-06-05 | Disposition: A | Discharge status: Home / Self Care | Payer: Medicaid Other | Source: Ambulatory Visit | Attending: Obstetrics and Gynecology | Admitting: Obstetrics and Gynecology

## 2014-06-05 DIAGNOSIS — O9989 Other specified diseases and conditions complicating pregnancy, childbirth and the puerperium: Secondary | ICD-10-CM | POA: Insufficient documentation

## 2014-06-05 DIAGNOSIS — R109 Unspecified abdominal pain: Secondary | ICD-10-CM | POA: Diagnosis present

## 2014-06-05 DIAGNOSIS — Z3A20 20 weeks gestation of pregnancy: Secondary | ICD-10-CM | POA: Diagnosis not present

## 2014-06-05 DIAGNOSIS — O26899 Other specified pregnancy related conditions, unspecified trimester: Secondary | ICD-10-CM

## 2014-06-05 LAB — URINALYSIS, ROUTINE W REFLEX MICROSCOPIC
Bilirubin Urine: NEGATIVE
GLUCOSE, UA: NEGATIVE mg/dL
HGB URINE DIPSTICK: NEGATIVE
KETONES UR: NEGATIVE mg/dL
Leukocytes, UA: NEGATIVE
NITRITE: NEGATIVE
PH: 5.5 (ref 5.0–8.0)
Protein, ur: NEGATIVE mg/dL
Specific Gravity, Urine: 1.01 (ref 1.005–1.030)
Urobilinogen, UA: 0.2 mg/dL (ref 0.0–1.0)

## 2014-06-05 NOTE — MAU Provider Note (Signed)
History     CSN: [id number]  Arrival date and time: 06/05/14 1631   First Provider Initiated Contact with Patient 06/05/14 1731      Chief Complaint  Patient presents with  . Back Pain  . Abdominal Cramping   HPI Annette Hutchinson 34 y.o. [id number] @[redacted]w[redacted]d  presents to MAU complaining of pain in lower abdomen and low back starting at 2am this morning.  It feels like the baby is balling up and there is a lot of tension.  Abdominal pain is intermittent and is 7-8/10 and described as cramping, worse L>R.  It occurs twice per hour and lasts 2 minutes.  Back pain is temporally unrelated.  She denies nausea, vomiting, weakness, vaginal bleeding, dysuria, headache.  She endorses good fetal movement.  She felt some wetness at 1:30 today when she went to the bathroom.  It was just the underwear but the bottom part was soaked front to back.   OB History    Gravida Para Term Preterm AB TAB SAB Ectopic Multiple Living   6 4 2 2 1 1    3       Past Medical History  Diagnosis Date  . Preterm labor     Past Surgical History  Procedure Laterality Date  . Cesarean section    . Cervical biopsy  w/ loop electrode excision      History reviewed. No pertinent family history.  History  Substance Use Topics  . Smoking status: Never Smoker   . Smokeless tobacco: Not on file  . Alcohol Use: No    Allergies:  Allergies  Allergen Reactions  . Amoxicillin Rash  . Erythromycin Rash  . Penicillins Rash    No prescriptions prior to admission    ROS Pertinent ROS in HPI  Physical Exam   Blood pressure 132/59, pulse 89, temperature 98.2 F (36.8 C), temperature source Oral, resp. rate 18, height 5\' 1"  (1.549 m), weight 187 lb (84.823 kg), last menstrual period 01/14/2014.  Physical Exam  Constitutional: She is oriented to person, place, and time. She appears well-developed and well-nourished. No distress.  HENT:  Head: Normocephalic and atraumatic.  Eyes: EOM are normal.  Neck: Normal range  of motion.  Cardiovascular: Normal rate and regular rhythm.   Respiratory: Effort normal and breath sounds normal. No respiratory distress.  GI: Soft. Bowel sounds are normal. She exhibits no distension. There is no tenderness. There is no rebound and no guarding.  Genitourinary:  Small amt of white physiologic discharge.  Cervix easily seen.  No pooling of fluid.  No fluid noted with valsalva.   Cervix is closed.  No CMT.    Musculoskeletal: Normal range of motion.  Tenderness to palpation in musculature of lower back L>R.    Neurological: She is alert and oriented to person, place, and time.  Skin: Skin is warm and dry.  Psychiatric: She has a normal mood and affect.   Results for orders placed or performed during the hospital encounter of 06/05/14 (from the past 24 hour(s))  Urinalysis, Routine w reflex microscopic     Status: None   Collection Time: 06/05/14  5:08 PM  Result Value Ref Range   Color, Urine YELLOW YELLOW   APPearance CLEAR CLEAR   Specific Gravity, Urine 1.010 1.005 - 1.030   pH 5.5 5.0 - 8.0   Glucose, UA NEGATIVE NEGATIVE mg/dL   Hgb urine dipstick NEGATIVE NEGATIVE   Bilirubin Urine NEGATIVE NEGATIVE   Ketones, ur NEGATIVE NEGATIVE mg/dL   Protein, ur  NEGATIVE NEGATIVE mg/dL   Urobilinogen, UA 0.2 0.0 - 1.0 mg/dL   Nitrite NEGATIVE NEGATIVE   Leukocytes, UA NEGATIVE NEGATIVE   Fetal heart tones detected on triage.  MAU Course  Procedures  MDM Discussed pt with Dr. Jackelyn Knife.  He spoke with pt earlier and she was noted to have constipation also.  He is agreeable to discharge pt to home with symptomatic treatment with: Heat, massage, Tylenol and Miralax PRN.     Assessment and Plan  A:  1. Abdominal pain in pregnancy    P: Discharge to home PRN Tylenol for discomfort PRN Miralax for constipation Warm heat to back, massage to back Follow up in clinic as needed/as scheduled Patient may return to MAU as needed or if her condition were to change or  worsen   Bertram Denver 06/05/2014, 5:32 PM

## 2014-06-05 NOTE — MAU Note (Signed)
Been having back pain and cramping in lower abd since 0200.  Also had some leaking fluid, noted around 1330; had to change.  Some ? Dampness since.

## 2014-06-05 NOTE — Discharge Instructions (Signed)

## 2014-08-20 ENCOUNTER — Encounter (HOSPITAL_COMMUNITY): Payer: Self-pay | Admitting: *Deleted

## 2014-08-20 ENCOUNTER — Inpatient Hospital Stay (HOSPITAL_COMMUNITY)
Admission: AD | Admit: 2014-08-20 | Discharge: 2014-08-20 | Disposition: A | Discharge status: Home / Self Care | Payer: Medicaid Other | Source: Ambulatory Visit | Attending: Obstetrics and Gynecology | Admitting: Obstetrics and Gynecology

## 2014-08-20 DIAGNOSIS — Z880 Allergy status to penicillin: Secondary | ICD-10-CM | POA: Insufficient documentation

## 2014-08-20 DIAGNOSIS — R12 Heartburn: Secondary | ICD-10-CM | POA: Diagnosis not present

## 2014-08-20 DIAGNOSIS — O9989 Other specified diseases and conditions complicating pregnancy, childbirth and the puerperium: Secondary | ICD-10-CM | POA: Diagnosis not present

## 2014-08-20 DIAGNOSIS — K219 Gastro-esophageal reflux disease without esophagitis: Secondary | ICD-10-CM

## 2014-08-20 DIAGNOSIS — R030 Elevated blood-pressure reading, without diagnosis of hypertension: Secondary | ICD-10-CM | POA: Diagnosis present

## 2014-08-20 DIAGNOSIS — Z3A31 31 weeks gestation of pregnancy: Secondary | ICD-10-CM | POA: Diagnosis not present

## 2014-08-20 HISTORY — DX: Unspecified abnormal cytological findings in specimens from vagina: R87.629

## 2014-08-20 LAB — URINE MICROSCOPIC-ADD ON

## 2014-08-20 LAB — URINALYSIS, ROUTINE W REFLEX MICROSCOPIC
BILIRUBIN URINE: NEGATIVE
Glucose, UA: NEGATIVE mg/dL
Ketones, ur: NEGATIVE mg/dL
LEUKOCYTES UA: NEGATIVE
NITRITE: NEGATIVE
PH: 6.5 (ref 5.0–8.0)
PROTEIN: NEGATIVE mg/dL
Specific Gravity, Urine: 1.01 (ref 1.005–1.030)
Urobilinogen, UA: 0.2 mg/dL (ref 0.0–1.0)

## 2014-08-20 NOTE — MAU Provider Note (Signed)
History     CSN: [id number]  Arrival date and time: 08/20/14 1013   First Provider Initiated Contact with Patient 08/20/14 1118      Chief Complaint  Patient presents with  . Leg Swelling  . Hypertension   HPI  [name] is a 34 y.o. [id number] at [redacted]w[redacted]d who presents to MAU today with complaint of elevated blood pressure and swelling. The patient states that she has had swelling "all over" for a while. She states that she woke up this morning and didn't feel well, had BP taken by RN at school and it was 150/110. She called the office and was told to come in. She states epigastric discomfort that has been ongoing. She denies headache, blurred vision or contractions.   OB History    Gravida Para Term Preterm AB TAB SAB Ectopic Multiple Living   Past Medical History  Diagnosis Date  . Preterm labor   . Vaginal Pap smear, abnormal     fine since LEEP    Past Surgical History  Procedure Laterality Date  . Cesarean section    . Cervical biopsy  w/ loop electrode excision    . Therapeutic abortion      Family History  Problem Relation Age of Onset  . Hypertension Mother   . Asthma Father   . Heart disease Son   . Cancer Paternal Aunt     cervical    History  Substance Use Topics  . Smoking status: Never Smoker   . Smokeless tobacco: Never Used  . Alcohol Use: No    Allergies:  Allergies  Allergen Reactions  . Amoxicillin Rash  . Erythromycin Rash  . Penicillins Rash    Prescriptions prior to admission  Medication Sig Dispense Refill Last Dose  . acetaminophen (TYLENOL) 500 MG tablet Take 1,000 mg by mouth every 6 (six) hours as needed for moderate pain.   Past Week at Unknown time  . flintstones complete (FLINTSTONES) 60 MG chewable tablet Chew 2 tablets by mouth daily.   08/19/2014 at Unknown time  . IRON PO Take 1 tablet by mouth daily.   08/19/2014 at Unknown time  . loratadine (CLARITIN) 10 MG tablet Take 10 mg by mouth daily as  needed for allergies.   Past Week at Unknown time    Review of Systems  Constitutional: Negative for fever and malaise/fatigue.  Eyes: Negative for blurred vision.  Gastrointestinal: Positive for abdominal pain. Negative for nausea, vomiting, diarrhea and constipation.  Genitourinary: Negative for dysuria, urgency and frequency.       Neg - vaginal bleeding, discharge, LOF  Neurological: Negative for headaches.   Physical Exam   Blood pressure 114/64, pulse 78, temperature 97.7 F (36.5 C), temperature source Oral, resp. rate 18, height  (1.575 m), weight 205 lb (92.987 kg), last menstrual period 01/14/2014.  Physical Exam  Constitutional: She is oriented to person, place, and time. She appears well-developed and well-nourished. No distress.  HENT:  Head: Normocephalic.  Cardiovascular: Normal rate.   Respiratory: Effort normal.  GI: Soft. She exhibits no distension and no mass. There is tenderness (mild epigastric discomfort with palpation). There is no rebound and no guarding.  Musculoskeletal: She exhibits edema (mild non pitting edema bilaterally).  Neurological: She is alert and oriented to person, place, and time. She has normal reflexes.  No clonus  Skin: Skin is warm and dry. No erythema.  Psychiatric: She has a normal mood and affect.   Results for orders placed or performed during the hospital encounter of 08/20/14 (from the past 24 hour(s))  Urinalysis, Routine w reflex microscopic     Status: Abnormal   Collection Time: 08/20/14 10:18 AM  Result Value Ref Range   Color, Urine YELLOW YELLOW   APPearance CLEAR CLEAR   Specific Gravity, Urine 1.010 1.005 - 1.030   pH 6.5 5.0 - 8.0   Glucose, UA NEGATIVE NEGATIVE mg/dL   Hgb urine dipstick TRACE (A) NEGATIVE   Bilirubin Urine NEGATIVE NEGATIVE   Ketones, ur NEGATIVE NEGATIVE mg/dL   Protein, ur NEGATIVE NEGATIVE mg/dL   Urobilinogen, UA 0.2 0.0 - 1.0 mg/dL   Nitrite NEGATIVE NEGATIVE   Leukocytes, UA NEGATIVE  NEGATIVE  Urine microscopic-add on     Status: None   Collection Time: 08/20/14 10:18 AM  Result Value Ref Range   Squamous Epithelial / LPF RARE RARE   WBC, UA 0-2 <3 WBC/hpf   Bacteria, UA RARE RARE   Patient Vitals for the past 24 hrs:  BP Temp Temp src Pulse Resp Height Weight  08/20/14 1132 114/64 mmHg - - 78 18 - -  08/20/14 1113 117/73 mmHg - - 85 - - -  08/20/14 1112 123/69 mmHg - - 95 - - -  08/20/14 1057 121/70 mmHg - - 96 - - -  08/20/14 1040 128/70 mmHg - - 94 18 - -  08/20/14 1019 127/80 mmHg 97.7 F (36.5 C) Oral 90 18 5\' 2"  (1.575 m) 205 lb (92.987 kg)    MAU Course  Procedures None  MDM UA today Discussed patient with Dr. Jackelyn KnifeMeisinger. Agrees with plan for discharge and follow-up in the office as scheduled given resolution of pain and normotensive in MAU  Assessment and Plan  A: SIUP at 6321w1d Heartburn in pregnancy Normotensive   P: Discharge home Patient advised to try OTC Pepcid for epigastric discomfort and heartburn AVS contains information on GERD diet Patient advised to follow-up with Dr. Jackelyn KnifeMeisinger as scheduled or sooner PRN Patient may return to MAU as needed or if her condition were to change or worsen   Marny LowensteinJulie N Ngozi Alvidrez, PA-C  08/20/2014, 11:37 AM

## 2014-08-20 NOTE — Discharge Instructions (Signed)
Pepcid is available over the counter and can be taken twice daily for heartburn and upper abdominal pain  Food Choices for Gastroesophageal Reflux Disease When you have gastroesophageal reflux disease (GERD), the foods you eat and your eating habits are very important. Choosing the right foods can help ease the discomfort of GERD. WHAT GENERAL GUIDELINES DO I NEED TO FOLLOW?  Choose fruits, vegetables, whole grains, low-fat dairy products, and low-fat meat, fish, and poultry.  Limit fats such as oils, salad dressings, butter, nuts, and avocado.  Keep a food diary to identify foods that cause symptoms.  Avoid foods that cause reflux. These may be different for different people.  Eat frequent small meals instead of three large meals each day.  Eat your meals slowly, in a relaxed setting.  Limit fried foods.  Cook foods using methods other than frying.  Avoid drinking alcohol.  Avoid drinking large amounts of liquids with your meals.  Avoid bending over or lying down until 2-3 hours after eating. WHAT FOODS ARE NOT RECOMMENDED? The following are some foods and drinks that may worsen your symptoms: Vegetables Tomatoes. Tomato juice. Tomato and spaghetti sauce. Chili peppers. Onion and garlic. Horseradish. Fruits Oranges, grapefruit, and lemon (fruit and juice). Meats High-fat meats, fish, and poultry. This includes hot dogs, ribs, ham, sausage, salami, and bacon. Dairy Whole milk and chocolate milk. Sour cream. Cream. Butter. Ice cream. Cream cheese.  Beverages Coffee and tea, with or without caffeine. Carbonated beverages or energy drinks. Condiments Hot sauce. Barbecue sauce.  Sweets/Desserts Chocolate and cocoa. Donuts. Peppermint and spearmint. Fats and Oils High-fat foods, including [place] fries and potato chips. Other Vinegar. Strong spices, such as black pepper, white pepper, red pepper, cayenne, curry powder, cloves, ginger, and chili powder. The items listed above  may not be a complete list of foods and beverages to avoid. Contact your dietitian for more information. Document Released: 05/10/2005 Document Revised: 05/15/2013 Document Reviewed: 03/14/2013 [facility] Patient Information 2015 [place]. This information is not intended to replace advice given to you by your health care provider. Make sure you discuss any questions you have with your health care provider.

## 2014-08-20 NOTE — MAU Note (Addendum)
Past wk has had an increase in swelling in hands and feet. Last night had a couple pains in lower abd, just wasn't feeling right. Was at school this morning,  Went to see the nurse, BP was up 150/110, checked twice, elevated both times. Pt asymptomatic. (though had ? Visual changes and RUP in the last month- none now) Called MD, instructed to come here

## 2014-08-20 NOTE — Progress Notes (Signed)
Annette NippleJulie Wenzel PA in earlier to discuss d/c plan. Written and verbal d/c instructions given and understanding voiced. To keep appt Thurs as scheduled and take PEpcid for heartburn as needed

## 2014-10-06 ENCOUNTER — Encounter (HOSPITAL_COMMUNITY): Payer: Self-pay | Admitting: *Deleted

## 2014-10-06 ENCOUNTER — Inpatient Hospital Stay (HOSPITAL_COMMUNITY)
Admission: AD | Admit: 2014-10-06 | Discharge: 2014-10-06 | Disposition: A | Discharge status: Home / Self Care | Payer: Medicaid Other | Source: Ambulatory Visit | Attending: Obstetrics and Gynecology | Admitting: Obstetrics and Gynecology

## 2014-10-06 DIAGNOSIS — Z3A37 37 weeks gestation of pregnancy: Secondary | ICD-10-CM | POA: Diagnosis not present

## 2014-10-06 HISTORY — DX: Other complications of anesthesia, initial encounter: T88.59XA

## 2014-10-06 HISTORY — DX: Adverse effect of unspecified anesthetic, initial encounter: T41.45XA

## 2014-10-06 NOTE — Discharge Instructions (Signed)
Braxton Hicks Contractions °Contractions of the uterus can occur throughout pregnancy. Contractions are not always a sign that you are in labor.  °WHAT ARE BRAXTON HICKS CONTRACTIONS?  °Contractions that occur before labor are called Braxton Hicks contractions, or false labor. Toward the end of pregnancy (32-34 weeks), these contractions can develop more often and may become more forceful. This is not true labor because these contractions do not result in opening (dilatation) and thinning of the cervix. They are sometimes difficult to tell apart from true labor because these contractions can be forceful and people have different pain tolerances. You should not feel embarrassed if you go to the hospital with false labor. Sometimes, the only way to tell if you are in true labor is for your health care provider to look for changes in the cervix. °If there are no prenatal problems or other health problems associated with the pregnancy, it is completely safe to be sent home with false labor and await the onset of true labor. °HOW CAN YOU TELL THE DIFFERENCE BETWEEN TRUE AND FALSE LABOR? °False Labor °· The contractions of false labor are usually shorter and not as hard as those of true labor.   °· The contractions are usually irregular.   °· The contractions are often felt in the front of the lower abdomen and in the groin.   °· The contractions may go away when you walk around or change positions while lying down.   °· The contractions get weaker and are shorter lasting as time goes on.   °· The contractions do not usually become progressively stronger, regular, and closer together as with true labor.   °True Labor °· Contractions in true labor last 30-70 seconds, become very regular, usually become more intense, and increase in frequency.   °· The contractions do not go away with walking.   °· The discomfort is usually felt in the top of the uterus and spreads to the lower abdomen and low back.   °· True labor can be  determined by your health care provider with an exam. This will show that the cervix is dilating and getting thinner.   °WHAT TO REMEMBER °· Keep up with your usual exercises and follow other instructions given by your health care provider.   °· Take medicines as directed by your health care provider.   °· Keep your regular prenatal appointments.   °· Eat and drink lightly if you think you are going into labor.   °· If Braxton Hicks contractions are making you uncomfortable:   °¨ Change your position from lying down or resting to walking, or from walking to resting.   °¨ Sit and rest in a tub of warm water.   °¨ Drink 2-3 glasses of water. Dehydration may cause these contractions.   °¨ Do slow and deep breathing several times an hour.   °WHEN SHOULD I SEEK IMMEDIATE MEDICAL CARE? °Seek immediate medical care if: °· Your contractions become stronger, more regular, and closer together.   °· You have fluid leaking or gushing from your vagina.   °· You have a fever.   °· You pass blood-tinged mucus.   °· You have vaginal bleeding.   °· You have continuous abdominal pain.   °· You have low back pain that you never had before.   °· You feel your baby's head pushing down and causing pelvic pressure.   °· Your baby is not moving as much as it used to.   °Document Released: 05/10/2005 Document Revised: 05/15/2013 Document Reviewed: 02/19/2013 °Exit[employer]® Patient Information ©2015 [employer]. This information is not intended to replace advice given to you by your health care   provider. Make sure you discuss any questions you have with your health care provider. ° °

## 2014-10-06 NOTE — MAU Note (Signed)
Pt may be d/c to home, hydrate po well at home and take warm bath. Pt to call Dr. Jackelyn KnifeMeisinger if contractions become stronger and more frequent.

## 2014-10-06 NOTE — MAU Note (Signed)
Pt states that she has been having some lower abdominal pressure and irregular contractions for past few days but became stronger today. Pt states that she developed hemmorhoids Thurs. night. Pt was seen at office on Thurs. And is thinned out as per pt.

## 2014-10-17 ENCOUNTER — Encounter (HOSPITAL_COMMUNITY): Payer: Self-pay

## 2014-10-17 NOTE — Patient Instructions (Addendum)
   Your procedure is scheduled on:  Tuesday, May 31  Enter through the Main Entrance of [facility] at: 8 AM Pick up the phone at the desk and dial [phone] and inform us of your arrival.  Please call this number if you have any problems the morning of surgery: [phone]  Remember: Do not eat or drink after midnight: Monday Take these medicines the morning of surgery with a SIP OF WATER:  NONE  Do not wear jewelry, make-up, or FINGER nail polish No metal in your hair or on your body. Do not wear lotions, powders, perfumes.  You may wear deodorant.  Do not bring valuables to the hospital. Contacts, dentures or bridgework may not be worn into surgery.  Leave suitcase in the car. After Surgery it may be brought to your room. For patients being admitted to the hospital, checkout time is 11:00am the day of discharge.  Home with husband Denyse AmassCorey cell [phone].

## 2014-10-18 ENCOUNTER — Encounter (HOSPITAL_COMMUNITY)
Admission: RE | Admit: 2014-10-18 | Discharge: 2014-10-18 | Disposition: A | Discharge status: Home / Self Care | Payer: Medicaid Other | Source: Ambulatory Visit | Attending: Obstetrics and Gynecology | Admitting: Obstetrics and Gynecology

## 2014-10-18 ENCOUNTER — Encounter (HOSPITAL_COMMUNITY): Payer: Self-pay

## 2014-10-18 DIAGNOSIS — Z01818 Encounter for other preprocedural examination: Secondary | ICD-10-CM | POA: Diagnosis present

## 2014-10-18 HISTORY — DX: Anemia, unspecified: D64.9

## 2014-10-18 HISTORY — DX: Anxiety disorder, unspecified: F41.9

## 2014-10-18 LAB — TYPE AND SCREEN
ABO/RH(D): O NEG
ANTIBODY SCREEN: POSITIVE
DAT, IGG: NEGATIVE
Unit division: 0
Unit division: 0

## 2014-10-18 LAB — CBC
HCT: 34.7 % — ABNORMAL LOW (ref 36.0–46.0)
HEMOGLOBIN: 11.7 g/dL — AB (ref 12.0–15.0)
MCH: 31.4 pg (ref 26.0–34.0)
MCHC: 33.7 g/dL (ref 30.0–36.0)
MCV: 93 fL (ref 78.0–100.0)
PLATELETS: 254 10*3/uL (ref 150–400)
RBC: 3.73 MIL/uL — AB (ref 3.87–5.11)
RDW: 14.1 % (ref 11.5–15.5)
WBC: 7.5 10*3/uL (ref 4.0–10.5)

## 2014-10-19 LAB — RPR: RPR Ser Ql: NONREACTIVE

## 2014-10-21 DIAGNOSIS — O34219 Maternal care for unspecified type scar from previous cesarean delivery: Secondary | ICD-10-CM | POA: Diagnosis present

## 2014-10-21 NOTE — H&P (Signed)
Annette Hutchinson is a 34 y.o. female [phone] at 39+ for rLTCS and BTL, given term status, h/o LTCS x 4, and undesired fertility.  Pregnancy complicated by family history of congenital heart disease, nl fetal echo in current pregnancy.  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 Antibody screen positive throughout pregnancy, always anti-D and too weak to titer.  Normal genetic screening.    Maternal Medical History:  Contractions: Frequency: irregular.    Fetal activity: Perceived fetal activity is normal.    Prenatal Complications - Diabetes: none.    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 Preterm labor   . Vaginal Pap smear, abnormal     fine since LEEP  . Complication of anesthesia itching  . Anxiety     no meds  . Anemia    Past Surgical History  Procedure Laterality Date  . Cesarean section      x 4, only 3 living - all delivered in [place]  . Cervical biopsy  w/ loop electrode excision    . Therapeutic abortion     Family History: family history includes Asthma in her father; Cancer in her paternal aunt; Heart disease in her son; Hypertension in her mother. Social History:  reports that she has never smoked. She has never used smokeless tobacco. She reports that she does not drink alcohol or use illicit drugs.married, student All: PCN, rash, Emycon rash   Prenatal Transfer Tool  Maternal Diabetes: No Genetic Screening: Normal Maternal Ultrasounds/Referrals: Normal Fetal Ultrasounds or other Referrals:  None Maternal Substance Abuse:  No Significant Maternal Medications:   None Significant Maternal Lab Results:  Lab values include: Group B Strep negative Other Comments:  None  Review of Systems  Constitutional: Negative.   HENT: Negative.   Eyes: Negative.   Respiratory: Negative.   Cardiovascular: Negative.   Gastrointestinal: Negative.   Genitourinary: Negative.   Musculoskeletal: Negative.   Skin: Negative.   Neurological: Negative.   Psychiatric/Behavioral: Negative.       Last menstrual period 01/14/2014. Maternal Exam:  Abdomen: Patient reports no abdominal tenderness. Surgical scars: low transverse.   Fundal height is appropriate for gestation.   Estimated fetal weight is 7-8#.   Fetal presentation: vertex  Introitus: Normal vulva. Normal vagina.    Physical Exam  Constitutional: She is oriented to person, place, and time. She appears well-developed and well-nourished.  HENT:  Head: Normocephalic and atraumatic.  Cardiovascular: Normal rate and regular rhythm.   Respiratory: Effort normal and breath sounds normal. No respiratory distress. She has no wheezes.  GI: Soft. Bowel sounds are normal. She exhibits no distension. There is no tenderness.  Musculoskeletal: Normal range of motion.  Neurological: She is alert and oriented to person, place, and time.  Skin: Skin is warm and dry.  Psychiatric: She has a normal mood and affect. Her behavior is normal.    Prenatal labs: ABO, Rh: --/--/O NEG (05/27 1100) Antibody: POS (05/27 1100) Rubella: Nonimmune (10/21 0000) RPR:  Non Reactive (05/27 1100)  HBsAg: Negative (10/21 0000)  HIV: NONREACTIVE (09/27 1525)  GBS:   negative  Hgb 13.2, Plt 298K, UrCx neg, Chl neg, GC neg,Varicella immune, First Tri Screen and AFP WNL, glucola 108, CF neg   Nl NT, fundal plac Nl anat, fundal plac, female; good growth, nl anat Nl fetal echo  Bile Acids WNL  Tdap 08/02/14, Rhogam 08/02/14, flu 07/04/14  Assessment/Plan: 33yo [id number] at 39+ for rLTCS and BTL by B salpingectomy D/w pt r/b/a of rLTCS  and BTL Gent/clinda for prophylaxis    Bovard-Stuckert, Isebella Upshur 10/21/2014, 9:51 PM

## 2014-10-22 ENCOUNTER — Inpatient Hospital Stay (HOSPITAL_COMMUNITY): Payer: Medicaid Other | Admitting: Anesthesiology

## 2014-10-22 ENCOUNTER — Inpatient Hospital Stay (HOSPITAL_COMMUNITY)
Admission: RE | Admit: 2014-10-22 | Discharge: 2014-10-24 | DRG: 765 | Disposition: A | Discharge status: Home / Self Care | Payer: Medicaid Other | Source: Ambulatory Visit | Attending: Obstetrics and Gynecology | Admitting: Obstetrics and Gynecology

## 2014-10-22 ENCOUNTER — Encounter (HOSPITAL_COMMUNITY)
Admission: RE | Disposition: A | Discharge status: Home / Self Care | Payer: Self-pay | Source: Ambulatory Visit | Attending: Obstetrics and Gynecology

## 2014-10-22 ENCOUNTER — Encounter (HOSPITAL_COMMUNITY): Payer: Self-pay | Admitting: Anesthesiology

## 2014-10-22 DIAGNOSIS — O9902 Anemia complicating childbirth: Secondary | ICD-10-CM | POA: Diagnosis present

## 2014-10-22 DIAGNOSIS — O9832 Other infections with a predominantly sexual mode of transmission complicating childbirth: Secondary | ICD-10-CM | POA: Diagnosis present

## 2014-10-22 DIAGNOSIS — Z881 Allergy status to other antibiotic agents status: Secondary | ICD-10-CM

## 2014-10-22 DIAGNOSIS — F419 Anxiety disorder, unspecified: Secondary | ICD-10-CM | POA: Diagnosis present

## 2014-10-22 DIAGNOSIS — N858 Other specified noninflammatory disorders of uterus: Secondary | ICD-10-CM | POA: Diagnosis present

## 2014-10-22 DIAGNOSIS — Z880 Allergy status to penicillin: Secondary | ICD-10-CM | POA: Diagnosis not present

## 2014-10-22 DIAGNOSIS — O34219 Maternal care for unspecified type scar from previous cesarean delivery: Secondary | ICD-10-CM

## 2014-10-22 DIAGNOSIS — Z302 Encounter for sterilization: Secondary | ICD-10-CM | POA: Diagnosis not present

## 2014-10-22 DIAGNOSIS — O09213 Supervision of pregnancy with history of pre-term labor, third trimester: Secondary | ICD-10-CM

## 2014-10-22 DIAGNOSIS — Z3A39 39 weeks gestation of pregnancy: Secondary | ICD-10-CM | POA: Diagnosis present

## 2014-10-22 DIAGNOSIS — Z9851 Tubal ligation status: Secondary | ICD-10-CM

## 2014-10-22 DIAGNOSIS — O3421 Maternal care for scar from previous cesarean delivery: Secondary | ICD-10-CM | POA: Diagnosis present

## 2014-10-22 DIAGNOSIS — Z98891 History of uterine scar from previous surgery: Secondary | ICD-10-CM

## 2014-10-22 DIAGNOSIS — A6000 Herpesviral infection of urogenital system, unspecified: Secondary | ICD-10-CM | POA: Diagnosis present

## 2014-10-22 DIAGNOSIS — O0943 Supervision of pregnancy with grand multiparity, third trimester: Secondary | ICD-10-CM

## 2014-10-22 DIAGNOSIS — O99344 Other mental disorders complicating childbirth: Secondary | ICD-10-CM | POA: Diagnosis present

## 2014-10-22 DIAGNOSIS — Z8249 Family history of ischemic heart disease and other diseases of the circulatory system: Secondary | ICD-10-CM | POA: Diagnosis not present

## 2014-10-22 HISTORY — DX: Tubal ligation status: Z98.51

## 2014-10-22 HISTORY — DX: History of uterine scar from previous surgery: Z98.891

## 2014-10-22 LAB — PREPARE RBC (CROSSMATCH)

## 2014-10-22 SURGERY — Surgical Case
Anesthesia: Spinal | Laterality: Bilateral

## 2014-10-22 MED ORDER — DIPHENHYDRAMINE HCL 25 MG PO CAPS: 25.0000 mg | ORAL_CAPSULE | ORAL | Status: DC | PRN

## 2014-10-22 MED ORDER — PHENYLEPHRINE 8 MG IN D5W 100 ML (0.08MG/ML) PREMIX OPTIME
INJECTION | INTRAVENOUS | Status: AC
  Filled 2014-10-22: qty 100

## 2014-10-22 MED ORDER — NALOXONE HCL 0.4 MG/ML IJ SOLN: 0.4000 mg | INTRAMUSCULAR | Status: DC | PRN

## 2014-10-22 MED ORDER — SIMETHICONE 80 MG PO CHEW
80.0000 mg | CHEWABLE_TABLET | Freq: Three times a day (TID) | ORAL | Status: DC
  Administered 2014-10-23 – 2014-10-24 (×4): 80 mg via ORAL
  Filled 2014-10-22 (×4): qty 1

## 2014-10-22 MED ORDER — DIPHENHYDRAMINE HCL 50 MG/ML IJ SOLN: 12.5000 mg | INTRAMUSCULAR | Status: DC | PRN

## 2014-10-22 MED ORDER — SCOPOLAMINE 1 MG/3DAYS TD PT72
1.0000 | MEDICATED_PATCH | Freq: Once | TRANSDERMAL | Status: DC
  Administered 2014-10-22: 1.5 mg via TRANSDERMAL

## 2014-10-22 MED ORDER — OXYTOCIN 40 UNITS IN LACTATED RINGERS INFUSION - SIMPLE MED: 62.5000 mL/h | INTRAVENOUS | Status: AC

## 2014-10-22 MED ORDER — WITCH HAZEL-GLYCERIN EX PADS: 1.0000 "application " | MEDICATED_PAD | CUTANEOUS | Status: DC | PRN

## 2014-10-22 MED ORDER — NALBUPHINE HCL 10 MG/ML IJ SOLN: 5.0000 mg | INTRAMUSCULAR | Status: DC | PRN

## 2014-10-22 MED ORDER — MEPERIDINE HCL 25 MG/ML IJ SOLN: 6.2500 mg | INTRAMUSCULAR | Status: DC | PRN

## 2014-10-22 MED ORDER — DIPHENHYDRAMINE HCL 25 MG PO CAPS: 25.0000 mg | ORAL_CAPSULE | Freq: Four times a day (QID) | ORAL | Status: DC | PRN

## 2014-10-22 MED ORDER — DIBUCAINE 1 % RE OINT: 1.0000 "application " | TOPICAL_OINTMENT | RECTAL | Status: DC | PRN

## 2014-10-22 MED ORDER — CEFAZOLIN SODIUM-DEXTROSE 2-3 GM-% IV SOLR
INTRAVENOUS | Status: AC
  Filled 2014-10-22: qty 50

## 2014-10-22 MED ORDER — SENNOSIDES-DOCUSATE SODIUM 8.6-50 MG PO TABS
2.0000 | ORAL_TABLET | ORAL | Status: DC
  Administered 2014-10-22 – 2014-10-23 (×2): 2 via ORAL
  Filled 2014-10-22 (×2): qty 2

## 2014-10-22 MED ORDER — MORPHINE SULFATE 0.5 MG/ML IJ SOLN
INTRAMUSCULAR | Status: AC
  Filled 2014-10-22: qty 10

## 2014-10-22 MED ORDER — PHENYLEPHRINE 8 MG IN D5W 100 ML (0.08MG/ML) PREMIX OPTIME
INJECTION | INTRAVENOUS | Status: DC | PRN
  Administered 2014-10-22: 60 ug/min via INTRAVENOUS

## 2014-10-22 MED ORDER — ONDANSETRON HCL 4 MG/2ML IJ SOLN
INTRAMUSCULAR | Status: AC
  Filled 2014-10-22: qty 2

## 2014-10-22 MED ORDER — SCOPOLAMINE 1 MG/3DAYS TD PT72
1.0000 | MEDICATED_PATCH | Freq: Once | TRANSDERMAL | Status: DC
  Filled 2014-10-22: qty 1

## 2014-10-22 MED ORDER — OXYTOCIN 10 UNIT/ML IJ SOLN
INTRAMUSCULAR | Status: AC
  Filled 2014-10-22: qty 4

## 2014-10-22 MED ORDER — SIMETHICONE 80 MG PO CHEW: 80.0000 mg | CHEWABLE_TABLET | ORAL | Status: DC | PRN

## 2014-10-22 MED ORDER — ACETAMINOPHEN 325 MG PO TABS: 650.0000 mg | ORAL_TABLET | ORAL | Status: DC | PRN

## 2014-10-22 MED ORDER — KETOROLAC TROMETHAMINE 30 MG/ML IJ SOLN
30.0000 mg | Freq: Four times a day (QID) | INTRAMUSCULAR | Status: AC | PRN
  Administered 2014-10-22: 30 mg via INTRAMUSCULAR

## 2014-10-22 MED ORDER — MEASLES, MUMPS & RUBELLA VAC ~~LOC~~ INJ
0.5000 mL | INJECTION | Freq: Once | SUBCUTANEOUS | Status: DC
  Filled 2014-10-22: qty 0.5

## 2014-10-22 MED ORDER — LACTATED RINGERS IV SOLN
INTRAVENOUS | Status: DC
  Administered 2014-10-22: 20:00:00 via INTRAVENOUS

## 2014-10-22 MED ORDER — LACTATED RINGERS IV SOLN
Freq: Once | INTRAVENOUS | Status: AC
  Administered 2014-10-22: 08:00:00 via INTRAVENOUS

## 2014-10-22 MED ORDER — SIMETHICONE 80 MG PO CHEW
80.0000 mg | CHEWABLE_TABLET | ORAL | Status: DC
  Administered 2014-10-22 – 2014-10-23 (×2): 80 mg via ORAL
  Filled 2014-10-22 (×2): qty 1

## 2014-10-22 MED ORDER — NALBUPHINE HCL 10 MG/ML IJ SOLN: 5.0000 mg | Freq: Once | INTRAMUSCULAR | Status: AC | PRN

## 2014-10-22 MED ORDER — NALBUPHINE HCL 10 MG/ML IJ SOLN
5.0000 mg | Freq: Once | INTRAMUSCULAR | Status: AC | PRN
  Administered 2014-10-22: 5 mg via SUBCUTANEOUS

## 2014-10-22 MED ORDER — FENTANYL CITRATE (PF) 100 MCG/2ML IJ SOLN
INTRAMUSCULAR | Status: AC
  Filled 2014-10-22: qty 2

## 2014-10-22 MED ORDER — OXYCODONE-ACETAMINOPHEN 5-325 MG PO TABS: 1.0000 | ORAL_TABLET | ORAL | Status: DC | PRN

## 2014-10-22 MED ORDER — FENTANYL CITRATE (PF) 100 MCG/2ML IJ SOLN
INTRAMUSCULAR | Status: AC
  Administered 2014-10-22: 50 ug via INTRAVENOUS
  Filled 2014-10-22: qty 2

## 2014-10-22 MED ORDER — DIPHENHYDRAMINE HCL 50 MG/ML IJ SOLN
INTRAMUSCULAR | Status: DC | PRN
  Administered 2014-10-22: 25 mg via INTRAVENOUS

## 2014-10-22 MED ORDER — LACTATED RINGERS IV SOLN
INTRAVENOUS | Status: DC
  Administered 2014-10-22 (×3): via INTRAVENOUS

## 2014-10-22 MED ORDER — ONDANSETRON HCL 4 MG/2ML IJ SOLN: 4.0000 mg | Freq: Three times a day (TID) | INTRAMUSCULAR | Status: DC | PRN

## 2014-10-22 MED ORDER — IBUPROFEN 800 MG PO TABS
800.0000 mg | ORAL_TABLET | Freq: Three times a day (TID) | ORAL | Status: DC
  Administered 2014-10-22 – 2014-10-24 (×5): 800 mg via ORAL
  Filled 2014-10-22 (×5): qty 1

## 2014-10-22 MED ORDER — LACTATED RINGERS IV SOLN: INTRAVENOUS | Status: DC

## 2014-10-22 MED ORDER — BUPIVACAINE IN DEXTROSE 0.75-8.25 % IT SOLN
INTRATHECAL | Status: DC | PRN
  Administered 2014-10-22: 1.5 mL via INTRATHECAL

## 2014-10-22 MED ORDER — MORPHINE SULFATE (PF) 0.5 MG/ML IJ SOLN
INTRAMUSCULAR | Status: DC | PRN
  Administered 2014-10-22: .15 mg via INTRATHECAL

## 2014-10-22 MED ORDER — BUPIVACAINE IN DEXTROSE 0.75-8.25 % IT SOLN
INTRATHECAL | Status: AC
  Filled 2014-10-22: qty 2

## 2014-10-22 MED ORDER — KETOROLAC TROMETHAMINE 30 MG/ML IJ SOLN
INTRAMUSCULAR | Status: AC
  Filled 2014-10-22: qty 1

## 2014-10-22 MED ORDER — ZOLPIDEM TARTRATE 5 MG PO TABS: 5.0000 mg | ORAL_TABLET | Freq: Every evening | ORAL | Status: DC | PRN

## 2014-10-22 MED ORDER — OXYCODONE-ACETAMINOPHEN 5-325 MG PO TABS
2.0000 | ORAL_TABLET | ORAL | Status: DC | PRN
  Administered 2014-10-23 – 2014-10-24 (×6): 2 via ORAL
  Filled 2014-10-22 (×6): qty 2

## 2014-10-22 MED ORDER — SCOPOLAMINE 1 MG/3DAYS TD PT72
MEDICATED_PATCH | TRANSDERMAL | Status: AC
  Administered 2014-10-22: 1.5 mg via TRANSDERMAL
  Filled 2014-10-22: qty 1

## 2014-10-22 MED ORDER — NALBUPHINE HCL 10 MG/ML IJ SOLN
INTRAMUSCULAR | Status: AC
  Administered 2014-10-22: 5 mg via SUBCUTANEOUS
  Filled 2014-10-22: qty 1

## 2014-10-22 MED ORDER — FENTANYL CITRATE (PF) 100 MCG/2ML IJ SOLN
25.0000 ug | INTRAMUSCULAR | Status: DC | PRN
  Administered 2014-10-22 (×2): 50 ug via INTRAVENOUS

## 2014-10-22 MED ORDER — FENTANYL CITRATE (PF) 100 MCG/2ML IJ SOLN
INTRAMUSCULAR | Status: DC | PRN
  Administered 2014-10-22: 25 ug via INTRAVENOUS
  Administered 2014-10-22: 25 ug via INTRATHECAL
  Administered 2014-10-22 (×2): 25 ug via INTRAVENOUS

## 2014-10-22 MED ORDER — KETOROLAC TROMETHAMINE 30 MG/ML IJ SOLN
30.0000 mg | Freq: Four times a day (QID) | INTRAMUSCULAR | Status: AC | PRN
  Administered 2014-10-22: 30 mg via INTRAVENOUS
  Filled 2014-10-22: qty 1

## 2014-10-22 MED ORDER — IBUPROFEN 800 MG PO TABS: 800.0000 mg | ORAL_TABLET | Freq: Three times a day (TID) | ORAL | Status: DC

## 2014-10-22 MED ORDER — OXYTOCIN 10 UNIT/ML IJ SOLN
40.0000 [IU] | INTRAVENOUS | Status: DC | PRN
  Administered 2014-10-22: 40 [IU] via INTRAVENOUS

## 2014-10-22 MED ORDER — SODIUM CHLORIDE 0.9 % IJ SOLN: 3.0000 mL | INTRAMUSCULAR | Status: DC | PRN

## 2014-10-22 MED ORDER — CEFAZOLIN SODIUM-DEXTROSE 2-3 GM-% IV SOLR
2.0000 g | INTRAVENOUS | Status: AC
  Administered 2014-10-22: 2 g via INTRAVENOUS

## 2014-10-22 MED ORDER — LANOLIN HYDROUS EX OINT: 1.0000 "application " | TOPICAL_OINTMENT | CUTANEOUS | Status: DC | PRN

## 2014-10-22 MED ORDER — PRENATAL MULTIVITAMIN CH
1.0000 | ORAL_TABLET | Freq: Every day | ORAL | Status: DC
  Administered 2014-10-23: 1 via ORAL
  Filled 2014-10-22: qty 1

## 2014-10-22 MED ORDER — NALOXONE HCL 1 MG/ML IJ SOLN
1.0000 ug/kg/h | INTRAVENOUS | Status: DC | PRN
  Filled 2014-10-22: qty 2

## 2014-10-22 MED ORDER — MENTHOL 3 MG MT LOZG: 1.0000 | LOZENGE | OROMUCOSAL | Status: DC | PRN

## 2014-10-22 MED ORDER — HYDROCORTISONE ACE-PRAMOXINE 1-1 % RE FOAM: 1.0000 | Freq: Two times a day (BID) | RECTAL | Status: DC | PRN

## 2014-10-22 MED ORDER — DIPHENHYDRAMINE HCL 50 MG/ML IJ SOLN
INTRAMUSCULAR | Status: AC
  Filled 2014-10-22: qty 1

## 2014-10-22 MED ORDER — NALBUPHINE HCL 10 MG/ML IJ SOLN
5.0000 mg | INTRAMUSCULAR | Status: DC | PRN
  Administered 2014-10-22: 5 mg via INTRAVENOUS
  Filled 2014-10-22: qty 1

## 2014-10-22 MED ORDER — PHENYLEPH-SHARK LIV OIL-MO-PET 0.25-3-14-71.9 % RE OINT
1.0000 "application " | TOPICAL_OINTMENT | Freq: Two times a day (BID) | RECTAL | Status: DC | PRN
  Filled 2014-10-22: qty 28.4

## 2014-10-22 MED ORDER — ONDANSETRON HCL 4 MG/2ML IJ SOLN
INTRAMUSCULAR | Status: DC | PRN
  Administered 2014-10-22: 4 mg via INTRAVENOUS

## 2014-10-22 MED ORDER — IBUPROFEN 600 MG PO TABS: 600.0000 mg | ORAL_TABLET | Freq: Four times a day (QID) | ORAL | Status: DC | PRN

## 2014-10-22 SURGICAL SUPPLY — 33 items
BENZOIN TINCTURE PRP APPL 2/3 (GAUZE/BANDAGES/DRESSINGS) ×2 IMPLANT
CLAMP CORD UMBIL (MISCELLANEOUS) IMPLANT
CLOTH BEACON ORANGE TIMEOUT ST (SAFETY) ×2 IMPLANT
CONTAINER PREFILL 10% NBF 15ML (MISCELLANEOUS) IMPLANT
DRAPE SHEET LG 3/4 BI-LAMINATE (DRAPES) IMPLANT
DRSG OPSITE POSTOP 4X10 (GAUZE/BANDAGES/DRESSINGS) ×2 IMPLANT
DURAPREP 26ML APPLICATOR (WOUND CARE) ×2 IMPLANT
ELECT REM PT RETURN 9FT ADLT (ELECTROSURGICAL) ×2
ELECTRODE REM PT RTRN 9FT ADLT (ELECTROSURGICAL) ×1 IMPLANT
EXTRACTOR VACUUM M CUP 4 TUBE (SUCTIONS) IMPLANT
GLOVE BIO SURGEON STRL SZ 6.5 (GLOVE) ×2 IMPLANT
GOWN STRL NON-REIN LRG LVL3 (GOWN DISPOSABLE) ×2 IMPLANT
GOWN STRL REUS W/TWL LRG LVL3 (GOWN DISPOSABLE) ×4 IMPLANT
KIT ABG SYR 3ML LUER SLIP (SYRINGE) IMPLANT
NEEDLE HYPO 25X5/8 SAFETYGLIDE (NEEDLE) IMPLANT
NS IRRIG 1000ML POUR BTL (IV SOLUTION) ×2 IMPLANT
PACK C SECTION WH (CUSTOM PROCEDURE TRAY) ×2 IMPLANT
PAD OB MATERNITY 4.3X12.25 (PERSONAL CARE ITEMS) ×2 IMPLANT
RTRCTR C-SECT PINK 25CM LRG (MISCELLANEOUS) ×2 IMPLANT
STAPLER VISISTAT 35W (STAPLE) IMPLANT
STRIP CLOSURE SKIN 1/4X4 (GAUZE/BANDAGES/DRESSINGS) ×2 IMPLANT
SUT MNCRL 0 VIOLET CTX 36 (SUTURE) ×2 IMPLANT
SUT MONOCRYL 0 CTX 36 (SUTURE) ×2
SUT PLAIN 1 NONE 54 (SUTURE) IMPLANT
SUT PLAIN 2 0 XLH (SUTURE) ×2 IMPLANT
SUT VIC AB 0 CT1 27 (SUTURE) ×2
SUT VIC AB 0 CT1 27XBRD ANBCTR (SUTURE) ×2 IMPLANT
SUT VIC AB 2-0 CT1 27 (SUTURE) ×2
SUT VIC AB 2-0 CT1 TAPERPNT 27 (SUTURE) ×2 IMPLANT
SUT VIC AB 4-0 KS 27 (SUTURE) IMPLANT
SYR BULB IRRIGATION 50ML (SYRINGE) ×2 IMPLANT
TOWEL OR 17X24 6PK STRL BLUE (TOWEL DISPOSABLE) ×2 IMPLANT
TRAY FOLEY CATH SILVER 14FR (SET/KITS/TRAYS/PACK) IMPLANT

## 2014-10-22 NOTE — Lactation Note (Signed)
This note was copied from the chart of Annette Hutchinson Mckinny. Lactation Consultation Note    Initial consult with this mom of a term baby, now 4 hours old. Mom has tried but not been successful with breast feeding her other children. Mom has full breast with dripping colostrum, and normal, evert nipples. She reports the baby being sleepy. I unwrapped the baby from a blanket, mom was holding her partially STS. The baby latched easily in cross cradle, with strong suckles and visible swallows. Mom asked if she needed to pump her breasts, and I told her no - not at this time, her baby should do well. Mom very sleepy , basic teaching done from baby and me book and lactation folder. Mom want to try football hold next. I told her to call out when baby next cues. Basic teaching on breast feeding and lactation servcies done. Mom knows to call for questions/conerns.  Patient Name: Annette Hutchinson Jaggers OZHYQ'MToday's Date: 10/22/2014    Maternal Data Formula Feeding for Exclusion: No Has patient been taught Hand Expression?: Yes Does the patient have breastfeeding experience prior to this delivery?: Yes  Feeding Feeding Type: Breast Fed Length of feed: 11 min  LATCH Score/Interventions Latch: Grasps breast easily, tongue down, lips flanged, rhythmical sucking. Intervention(s): Skin to skin;Teach feeding cues;Waking techniques  Audible Swallowing: Spontaneous and intermittent (visible)  Type of Nipple: Everted at rest and after stimulation  Comfort (Breast/Nipple): Soft / non-tender     Hold (Positioning): Assistance needed to correctly position infant at breast and maintain latch. Intervention(s): Breastfeeding basics reviewed;Support Pillows;Position options;Skin to skin  LATCH Score: 9  Lactation Tools Discussed/Used     Consult Status Consult Status: Follow-up Date: 10/23/14 Follow-up type: In-patient    Alfred LevinsLee, Evadene Wardrip Anne 10/22/2014, 3:25 PM

## 2014-10-22 NOTE — Anesthesia Postprocedure Evaluation (Signed)
  Anesthesia Post-op Note  Patient: Siri Coleandace Singleterry  Procedure(s) Performed: Procedure(s) with comments: REPEAT CESAREAN SECTION WITH BILATERAL TUBAL LIGATION (Bilateral) - 5th cesarean Dr. Jackelyn KnifeMeisinger assisting No RNFA needed per Dr. Ellyn HackBovard  Patient Location: PACU  Anesthesia Type:Regional  Level of Consciousness: awake, alert , oriented and patient cooperative  Airway and Oxygen Therapy: Patient Spontanous Breathing  Post-op Pain: none  Post-op Assessment: Post-op Vital signs reviewed, Pain level controlled, No headache, No backache, No residual numbness and No residual motor weakness  Post-op Vital Signs: Reviewed and stable  Last Vitals:  Filed Vitals:   10/22/14 1614  BP: 78/50  Pulse: 114  Temp:   Resp: 18    Complications: No apparent anesthesia complications

## 2014-10-22 NOTE — Anesthesia Procedure Notes (Signed)
Spinal Patient location during procedure: OR Start time: 10/22/2014 9:31 AM Staffing Anesthesiologist: Mal AmabileFOSTER, Isiaih Hollenbach Performed by: anesthesiologist  Preanesthetic Checklist Completed: patient identified, site marked, surgical consent, pre-op evaluation, timeout performed, IV checked, risks and benefits discussed and monitors and equipment checked Spinal Block Patient position: sitting Prep: site prepped and draped and DuraPrep Patient monitoring: cardiac monitor, continuous pulse ox, blood pressure and heart rate Approach: midline Location: L3-4 Injection technique: catheter Needle Needle type: Tuohy, Sprotte and Spinocan  Needle gauge: 25 G Needle length: 12.7 cm Catheter type: closed end flexible Catheter size: 19 g Assessment Sensory level: T4 Events: paresthesia Additional Notes Patient tolerated procedure well. Adequate sensory level. Epidural performed as above LOR with air. Spinal performed through epidural needle. CSF clear, free flow, no heme, transient paresthesia right leg.Rx injected and spinal needle withdrawn. Epidural catheter then threaded 5 cm into epidural space. Epidural needle then withdrawn and sterile dressing applied. Patient then placed supine with LUD.

## 2014-10-22 NOTE — Interval H&P Note (Signed)
History and Physical Interval Note:  10/22/2014 9:01 AM  Annette Hutchinson  has presented today for surgery, with the diagnosis of Repeat C/Section,DESIRE STERILIZATION  The various methods of treatment have been discussed with the patient and family. After consideration of risks, benefits and other options for treatment, the patient has consented to  Procedure(s) with comments: REPEAT CESAREAN SECTION WITH BILATERAL TUBAL LIGATION (Bilateral) - 5th cesarean Dr. Jackelyn KnifeMeisinger assisting No RNFA needed per Dr. Ellyn HackBovard as a surgical intervention .  The patient's history has been reviewed, patient examined, no change in status, stable for surgery.  I have reviewed the patient's chart and labs.  Questions were answered to the patient's satisfaction.     Bovard-Stuckert, Doll Frazee

## 2014-10-22 NOTE — Anesthesia Preprocedure Evaluation (Addendum)
Anesthesia Evaluation  Patient identified by MRN, date of birth, ID band Patient awake    Reviewed: Allergy & Precautions, NPO status , Patient's Chart, lab work & pertinent test results  History of Anesthesia Complications (+) history of anesthetic complications  Airway Mallampati: II  TM Distance: >3 FB Neck ROM: Full    Dental no notable dental hx. (+) Teeth Intact   Pulmonary neg pulmonary ROS,  breath sounds clear to auscultation  Pulmonary exam normal       Cardiovascular negative cardio ROS Normal cardiovascular examRhythm:Regular Rate:Normal     Neuro/Psych negative neurological ROS  negative psych ROS   GI/Hepatic negative GI ROS, Neg liver ROS,   Endo/Other  Morbid obesity  Renal/GU negative Renal ROS  negative genitourinary   Musculoskeletal negative musculoskeletal ROS (+)   Abdominal (+) + obese,   Peds  Hematology  (+) anemia ,   Anesthesia Other Findings   Reproductive/Obstetrics (+) Pregnancy Previous C/Section                           Anesthesia Physical Anesthesia Plan  ASA: III  Anesthesia Plan: Combined Spinal and Epidural   Post-op Pain Management:    Induction:   Airway Management Planned: Natural Airway  Additional Equipment:   Intra-op Plan:   Post-operative Plan:   Informed Consent: I have reviewed the patients History and Physical, chart, labs and discussed the procedure including the risks, benefits and alternatives for the proposed anesthesia with the patient or authorized representative who has indicated his/her understanding and acceptance.     Plan Discussed with: Anesthesiologist, CRNA and Surgeon  Anesthesia Plan Comments:       Anesthesia Quick Evaluation

## 2014-10-22 NOTE — Transfer of Care (Signed)
Immediate Anesthesia Transfer of Care Note  Patient: Annette Hutchinson  Procedure(s) Performed: Procedure(s) with comments: REPEAT CESAREAN SECTION WITH BILATERAL TUBAL LIGATION (Bilateral) - 5th cesarean Dr. Jackelyn KnifeMeisinger assisting No RNFA needed per Dr. Ellyn HackBovard  Patient Location: PACU  Anesthesia Type:Spinal and Epidural  Level of Consciousness: awake, alert  and oriented  Airway & Oxygen Therapy: Patient Spontanous Breathing  Post-op Assessment: Report given to RN  Post vital signs: Reviewed and stable  Last Vitals:  Filed Vitals:   10/22/14 0803  Pulse: 100  Temp: 36.5 C  Resp: 20    Complications: No apparent anesthesia complications

## 2014-10-22 NOTE — Anesthesia Postprocedure Evaluation (Signed)
  Anesthesia Post-op Note  Patient: Annette Hutchinson  Procedure(s) Performed: Procedure(s) with comments: REPEAT CESAREAN SECTION WITH BILATERAL TUBAL LIGATION (Bilateral) - 5th cesarean Dr. Jackelyn KnifeMeisinger assisting No RNFA needed per Dr. Ellyn HackBovard  Patient Location: PACU  Anesthesia Type:Spinal  Level of Consciousness: awake, alert  and oriented  Airway and Oxygen Therapy: Patient Spontanous Breathing  Post-op Pain: none  Post-op Assessment: Post-op Vital signs reviewed, Patient's Cardiovascular Status Stable, Respiratory Function Stable, Patent Airway, No signs of Nausea or vomiting, Pain level controlled, No headache, No backache and No residual numbness  Post-op Vital Signs: Reviewed and stable  Last Vitals:  Filed Vitals:   10/22/14 1200  BP:   Pulse: 65  Temp: 36.6 C  Resp: 20    Complications: No apparent anesthesia complications

## 2014-10-22 NOTE — Addendum Note (Signed)
Addendum  created 10/22/14 1751 by Rosalia HammersMargaret S Zeenat Jeanbaptiste, CRNA   Modules edited: Notes Section   Notes Section:  File: [id number]

## 2014-10-22 NOTE — Brief Op Note (Addendum)
10/22/2014  10:44 AM  PATIENT:  Annette Hutchinson  34 y.o. female  PRE-OPERATIVE DIAGNOSIS:  Repeat C/Section,DESIRE STERILIZATION  POST-OPERATIVE DIAGNOSIS:  Repeat C/Section,DESIRE STERILIZATION  PROCEDURE:  Procedure(s) with comments: REPEAT CESAREAN SECTION WITH BILATERAL TUBAL LIGATION (Bilateral) - 5th cesarean Dr. Jackelyn KnifeMeisinger assisting No RNFA needed per Dr. Ellyn HackBovard  SURGEON:  Surgeon(s) and Role:    * Annette ReinJody Bovard-Stuckert, MD - Primary  ASSISTANTS: Meisinger, Todd MD   FINDINGS: viable female infant at 9:52 am, apgars 8/9, wt 8#6, nl uterus, tubes and ovaries  ANESTHESIA:   spinal  EBL:  Total I/O In: 2500 [I.V.:2500] Out: 650 [Urine:150; Blood:500]  BLOOD ADMINISTERED:none  DRAINS: Urinary Catheter (Foley)   LOCAL MEDICATIONS USED:  LIDOCAINE   SPECIMEN:  Source of Specimen:  Placenta, B tubal segments   DISPOSITION OF SPECIMEN:  L&D, pathology  COUNTS:  YES  TOURNIQUET:  * No tourniquets in log *  DICTATION: .Other Dictation: Dictation Number [id number]  PLAN OF CARE: Admit to inpatient   PATIENT DISPOSITION:  PACU - hemodynamically stable.   Delay start of Pharmacological VTE agent (>24hrs) due to surgical blood loss or risk of bleeding: not applicable

## 2014-10-22 NOTE — Interval H&P Note (Signed)
History and Physical Interval Note:  10/22/2014 9:03 AM  Annette Hutchinson  has presented today for surgery, with the diagnosis of Repeat C/Section,DESIRE STERILIZATION  The various methods of treatment have been discussed with the patient and family. After consideration of risks, benefits and other options for treatment, the patient has consented to  Procedure(s) with comments: REPEAT CESAREAN SECTION WITH BILATERAL TUBAL LIGATION (Bilateral) - 5th cesarean Dr. Jackelyn KnifeMeisinger assisting No RNFA needed per Dr. Ellyn HackBovard as a surgical intervention .  The patient's history has been reviewed, patient examined, no change in status, stable for surgery.  I have reviewed the patient's chart and labs.  Questions were answered to the patient's satisfaction.     Bovard-Stuckert, Shelaine Frie

## 2014-10-23 ENCOUNTER — Encounter (HOSPITAL_COMMUNITY): Payer: Self-pay | Admitting: Obstetrics and Gynecology

## 2014-10-23 LAB — CBC
HCT: 31.6 % — ABNORMAL LOW (ref 36.0–46.0)
HEMOGLOBIN: 10.5 g/dL — AB (ref 12.0–15.0)
MCH: 31.3 pg (ref 26.0–34.0)
MCHC: 33.2 g/dL (ref 30.0–36.0)
MCV: 94 fL (ref 78.0–100.0)
PLATELETS: 213 10*3/uL (ref 150–400)
RBC: 3.36 MIL/uL — ABNORMAL LOW (ref 3.87–5.11)
RDW: 14.6 % (ref 11.5–15.5)
WBC: 8.7 10*3/uL (ref 4.0–10.5)

## 2014-10-23 LAB — BASIC METABOLIC PANEL
Anion gap: 7 (ref 5–15)
BUN: 6 mg/dL (ref 6–20)
CO2: 23 mmol/L (ref 22–32)
CREATININE: 0.7 mg/dL (ref 0.44–1.00)
Calcium: 8.5 mg/dL — ABNORMAL LOW (ref 8.9–10.3)
Chloride: 106 mmol/L (ref 101–111)
GFR calc Af Amer: >60 mL/min (ref >60)
GFR calc non Af Amer: >60 mL/min (ref >60)
Glucose, Bld: 90 mg/dL (ref 65–99)
Potassium: 4.3 mmol/L (ref 3.5–5.1)
Sodium: 136 mmol/L (ref 135–145)

## 2014-10-23 NOTE — Op Note (Signed)
[place] Hutchinson:  Santarelli, Tabbitha               ACCOUNT NO.:  [id number]  MEDICAL RECORD NO.:  [id number]  LOCATION:  9132                          FACILITY:  [facility]  PHYSICIAN:  Sherron MondayJody Bovard, MD        DATE OF BIRTH:  March 12, 1981  DATE OF PROCEDURE:  10/22/2014 DATE OF DISCHARGE:                              OPERATIVE REPORT   PREOPERATIVE DIAGNOSES:  Intrauterine pregnancy at term, history of low- transverse cesarean section x4, undesired fertility.  POSTOPERATIVE DIAGNOSES:  Intrauterine pregnancy at term, history of low- transverse cesarean section x4, undesired fertility, delivered.  PROCEDURES:  Low-transverse cesarean section with bilateral salpingectomy for bilateral tubal ligation.  SURGEON:  Sherron MondayJody Bovard, MD  ASSISTANT:  Zenaida Nieceodd D. Meisinger, M.D.  FINDINGS:  Viable female infant at 9:52 a.m. with Apgars of 8 at one minute and 9 at five minutes in a weight of 8 pounds 6 ounces.  Normal uterus, tubes, and ovaries were noted.  ANESTHESIA:  Spinal.  EBL:  Approximately 500 mL.  URINE OUTPUT:  150 mL clear urine.  IV FLUIDS:  2500 mL.  COMPLICATIONS:  None.  PATHOLOGY:  Placenta to L and D.  DESCRIPTION OF PROCEDURE:  After informed consent was reviewed with the patient and her partner, she was transferred to the operating room, where spinal anesthesia was placed and found to be adequate.  She was then returned to the supine position with a leftward tilt.  After appropriate prep and drape, and a time-out, a Pfannenstiel skin incision was made at the level of her previous incision, carried through the underlying layer of fascia with the Bovie cautery.  The superior aspect of the fascial incision was grasped with Kocher clamp, elevating the rectus muscles were dissected off both bluntly and sharply.  Midline was easily identified and this was extended superiorly and inferiorly with good visualization of the bladder.  The Alexis retractor was placed carefully making sure no bowel was  entrapped.  The uterus was assessed and incised in transverse fashion above the bladder flap.  Infant was delivered from a vertex presentation with no complications.  Nose and mouth were suctioned.  Cord was clamped and cut.  Infant was handed off to the waiting pediatric staff.  The placenta was then manually extracted.  The uterus was cleared of all clot and debris.  Uterine incision was closed with 2 layers of 0 Monocryl, first of which is running locked and the second as an imbricating layer.  The tubes were identified followed out to the fimbriated end.  There was minimal amount of adhesions on the left at the ovary.  A Tresa EndoKelly was used to separate the tube.  The tube was removed and then doubly ligated with plain gut.  The same was performed on the right.  There were no adhesions noted on the right and again the tube was excised and doubly ligated.  This was noted to be hemostatic.  The incision was also hemostatic.  The Alexis skin retractor was removed.  The peritoneum was reapproximated with 2-0 Vicryl in a running fashion.  Subfascial planes were inspected, found to be hemostatic.  The fascia was closed with 0 Vicryl from either aspect overlapping  in the midline.  The subcuticular planes were made hemostatic with Bovie cautery and the dead space was closed with plain gut.  The skin was closed with 4-0 Vicryl on a Keith needle.  Benzoin and Steri-Strips were applied.  The patient tolerated the procedure well.  Sponge, lap, and needle count was correct x2 per the operating room staff.     Sherron Monday, MD     JB/MEDQ  D:  10/22/2014  T:  10/23/2014  Job:  [id number]

## 2014-10-23 NOTE — Progress Notes (Signed)
Subjective: Postpartum Day 1: Cesarean Delivery Patient reports incisional pain and tolerating PO.  Nl lochia, pain controlled  Objective: Vital signs in last 24 hours: Temp:  [97.3 F (36.3 C)-98.5 F (36.9 C)] 98.4 F (36.9 C) (06/01 0555) Pulse Rate:  [56-114] 72 (06/01 0555) Resp:  [18-21] 18 (06/01 0555) BP: (78-132)/(50-99) 114/71 mmHg (06/01 0555) SpO2:  [95 %-99 %] 98 % (06/01 0555) Weight:  [96.616 kg (213 lb)] 96.616 kg (213 lb) (05/31 1900)  Physical Exam:  General: alert and no distress Lochia: appropriate Uterine Fundus: firm Incision: healing well DVT Evaluation: No evidence of DVT seen on physical exam.   Recent Labs  10/23/14 0540  HGB 10.5*  HCT 31.6*    Assessment/Plan: Status post Cesarean section. Doing well postoperatively.  Continue current care.  Bovard-Stuckert, Dimetri Armitage 10/23/2014, 6:59 AM

## 2014-10-24 MED ORDER — OXYCODONE-ACETAMINOPHEN 5-325 MG PO TABS: 1.0000 | ORAL_TABLET | Freq: Four times a day (QID) | ORAL | Status: AC | PRN

## 2014-10-24 MED ORDER — IBUPROFEN 800 MG PO TABS: 800.0000 mg | ORAL_TABLET | Freq: Three times a day (TID) | ORAL | Status: DC | PRN

## 2014-10-24 NOTE — Discharge Summary (Signed)
Obstetric Discharge Summary Reason for Admission: cesarean section Prenatal Procedures: none Intrapartum Procedures: cesarean: low cervical, transverse and tubal ligation Postpartum Procedures: Rubella Ig Complications-Operative and Postpartum: none HEMOGLOBIN  Date Value Ref Range Status  10/23/2014 10.5* 12.0 - 15.0 g/dL Final   HCT  Date Value Ref Range Status  10/23/2014 31.6* 36.0 - 46.0 % Final    Physical Exam:  General: alert and no distress Lochia: appropriate Uterine Fundus: firm Incision: healing well DVT Evaluation: No evidence of DVT seen on physical exam.  Discharge Diagnoses: Term Pregnancy-delivered  Discharge Information: Date: 10/24/2014 Activity: pelvic rest Diet: routine Medications: PNV, Ibuprofen and Percocet Condition: stable Instructions: refer to practice specific booklet Discharge to: home Follow-up Information    Follow up with Bovard-Stuckert, Noami Bove, MD. Schedule an appointment as soon as possible for a visit in 2 weeks.   Specialty:  Obstetrics and Gynecology   Why:  Incision check, 6 wk full postpartum check   Contact information:   [address] [place] [id number] [phone]       Newborn Data: Live born female  Birth Weight: 8 lb 6 oz (3799 g) APGAR: 8, 9  Home with mother.  Bovard-Stuckert, Dontaye Hur 10/24/2014, 7:55 AM

## 2014-10-24 NOTE — Progress Notes (Signed)
Subjective: Postpartum Day 2: Cesarean Delivery Patient reports incisional pain, tolerating PO and no problems voiding.    Objective: Vital signs in last 24 hours: Temp:  [97.6 F (36.4 C)-98.1 F (36.7 C)] 98.1 F (36.7 C) (06/02 54090633) Pulse Rate:  [58-74] 74 (06/02 0633) Resp:  [18-20] 18 (06/02 81190633) BP: (113-129)/(64-85) 128/85 mmHg (06/02 14780633)  Physical Exam:  General: alert and no distress Lochia: appropriate Uterine Fundus: firm Incision: healing well DVT Evaluation: No evidence of DVT seen on physical exam.   Recent Labs  10/23/14 0540  HGB 10.5*  HCT 31.6*    Assessment/Plan: Status post Cesarean section. Doing well postoperatively.  Continue current care.  Pt desires d/c home - d/c with Motrin, percocet and PNV, f/u 2 weeks  Bovard-Stuckert, Frederic Tones 10/24/2014, 7:36 AM

## 2014-10-25 LAB — TYPE AND SCREEN
ABO/RH(D): O NEG
Antibody Screen: POSITIVE
DAT, IgG: NEGATIVE
UNIT DIVISION: 0
Unit division: 0

## 2015-01-06 ENCOUNTER — Encounter (HOSPITAL_BASED_OUTPATIENT_CLINIC_OR_DEPARTMENT_OTHER): Payer: Self-pay | Admitting: *Deleted

## 2015-01-06 ENCOUNTER — Emergency Department (HOSPITAL_BASED_OUTPATIENT_CLINIC_OR_DEPARTMENT_OTHER): Payer: Medicaid Other

## 2015-01-06 ENCOUNTER — Emergency Department (HOSPITAL_BASED_OUTPATIENT_CLINIC_OR_DEPARTMENT_OTHER)
Admission: EM | Admit: 2015-01-06 | Discharge: 2015-01-06 | Disposition: A | Discharge status: Home / Self Care | Payer: Medicaid Other | Attending: Emergency Medicine | Admitting: Emergency Medicine

## 2015-01-06 DIAGNOSIS — Z880 Allergy status to penicillin: Secondary | ICD-10-CM | POA: Diagnosis not present

## 2015-01-06 DIAGNOSIS — Z8659 Personal history of other mental and behavioral disorders: Secondary | ICD-10-CM | POA: Insufficient documentation

## 2015-01-06 DIAGNOSIS — Y998 Other external cause status: Secondary | ICD-10-CM | POA: Diagnosis not present

## 2015-01-06 DIAGNOSIS — S82832A Other fracture of upper and lower end of left fibula, initial encounter for closed fracture: Secondary | ICD-10-CM | POA: Diagnosis not present

## 2015-01-06 DIAGNOSIS — Z79899 Other long term (current) drug therapy: Secondary | ICD-10-CM | POA: Diagnosis not present

## 2015-01-06 DIAGNOSIS — Y9289 Other specified places as the place of occurrence of the external cause: Secondary | ICD-10-CM | POA: Insufficient documentation

## 2015-01-06 DIAGNOSIS — S99922A Unspecified injury of left foot, initial encounter: Secondary | ICD-10-CM | POA: Diagnosis present

## 2015-01-06 DIAGNOSIS — Z862 Personal history of diseases of the blood and blood-forming organs and certain disorders involving the immune mechanism: Secondary | ICD-10-CM | POA: Diagnosis not present

## 2015-01-06 DIAGNOSIS — Z8751 Personal history of pre-term labor: Secondary | ICD-10-CM | POA: Diagnosis not present

## 2015-01-06 DIAGNOSIS — X58XXXA Exposure to other specified factors, initial encounter: Secondary | ICD-10-CM | POA: Diagnosis not present

## 2015-01-06 DIAGNOSIS — Y9389 Activity, other specified: Secondary | ICD-10-CM | POA: Diagnosis not present

## 2015-01-06 MED ORDER — IBUPROFEN 800 MG PO TABS
800.0000 mg | ORAL_TABLET | Freq: Once | ORAL | Status: AC
  Administered 2015-01-06: 800 mg via ORAL
  Filled 2015-01-06: qty 1

## 2015-01-06 MED ORDER — IBUPROFEN 800 MG PO TABS: 800.0000 mg | ORAL_TABLET | Freq: Three times a day (TID) | ORAL | Status: AC

## 2015-01-06 NOTE — Discharge Instructions (Signed)
Take ibuprofen as directed. Ice and elevate your ankle.  Ankle Fracture A fracture is a break in a bone. The ankle joint is made up of three bones. These include the lower (distal)sections of your lower leg bones, called the tibia and fibula, along with a bone in your foot, called the talus. Depending on how bad the break is and if more than one ankle joint bone is broken, a cast or splint is used to protect and keep your injured bone from moving while it heals. Sometimes, surgery is required to help the fracture heal properly.  There are two general types of fractures:  Stable fracture. This includes a single fracture line through one bone, with no injury to ankle ligaments. A fracture of the talus that does not have any displacement (movement of the bone on either side of the fracture line) is also stable.  Unstable fracture. This includes more than one fracture line through one or more bones in the ankle joint. It also includes fractures that have displacement of the bone on either side of the fracture line. CAUSES  A direct blow to the ankle.   Quickly and severely twisting your ankle.  Trauma, such as a car accident or falling from a significant height. RISK FACTORS You may be at a higher risk of ankle fracture if:  You have certain medical conditions.  You are involved in high-impact sports.  You are involved in a high-impact car accident. SIGNS AND SYMPTOMS   Tender and swollen ankle.  Bruising around the injured ankle.  Pain on movement of the ankle.  Difficulty walking or putting weight on the ankle.  A cold foot below the site of the ankle injury. This can occur if the blood vessels passing through your injured ankle were also damaged.  Numbness in the foot below the site of the ankle injury. DIAGNOSIS  An ankle fracture is usually diagnosed with a physical exam and X-rays. A CT scan may also be required for complex fractures. TREATMENT  Stable fractures are  treated with a cast or splint and using crutches to avoid putting weight on your injured ankle. This is followed by an ankle strengthening program. Some patients require a special type of cast, depending on other medical problems they may have. Unstable fractures require surgery to ensure the bones heal properly. Your health care provider will tell you what type of fracture you have and the best treatment for your condition. HOME CARE INSTRUCTIONS   Review correct crutch use with your health care provider and use your crutches as directed. Safe use of crutches is extremely important. Misuse of crutches can cause you to fall or cause injury to nerves in your hands or armpits.  Do not put weight or pressure on the injured ankle until directed by your health care provider.  To lessen the swelling, keep the injured leg elevated while sitting or lying down.  Apply ice to the injured area:  Put ice in a plastic bag.  Place a towel between your cast and the bag.  Leave the ice on for 20 minutes, 2-3 times a day.  If you have a plaster or fiberglass cast:  Do not try to scratch the skin under the cast with any objects. This can increase your risk of skin infection.  Check the skin around the cast every day. You may put lotion on any red or sore areas.  Keep your cast dry and clean.  If you have a plaster splint:  Wear  the splint as directed.  You may loosen the elastic around the splint if your toes become numb, tingle, or turn cold or blue.  Do not put pressure on any part of your cast or splint; it may break. Rest your cast only on a pillow the first 24 hours until it is fully hardened.  Your cast or splint can be protected during bathing with a plastic bag sealed to your skin with medical tape. Do not lower the cast or splint into water.  Take medicines as directed by your health care provider. Only take over-the-counter or prescription medicines for pain, discomfort, or fever as  directed by your health care provider.  Do not drive a vehicle until your health care provider specifically tells you it is safe to do so.  If your health care provider has given you a follow-up appointment, it is very important to keep that appointment. Not keeping the appointment could result in a chronic or permanent injury, pain, and disability. If you have any problem keeping the appointment, call the facility for assistance. SEEK MEDICAL CARE IF: You develop increased swelling or discomfort. SEEK IMMEDIATE MEDICAL CARE IF:   Your cast gets damaged or breaks.  You have continued severe pain.  You develop new pain or swelling after the cast was put on.  Your skin or toenails below the injury turn blue or gray.  Your skin or toenails below the injury feel cold, numb, or have loss of sensitivity to touch.  There is a bad smell or pus draining from under the cast. MAKE SURE YOU:   Understand these instructions.  Will watch your condition.  Will get help right away if you are not doing well or get worse. Document Released: 05/07/2000 Document Revised: 05/15/2013 Document Reviewed: 12/07/2012 [facility] Patient Information 2015 [place], [place]. This information is not intended to replace advice given to you by your health care provider. Make sure you discuss any questions you have with your health care provider.  Fibular Fracture, Ankle, Adult, Treated With or Without Immobilization A fibular fracture at your ankle is a break (fracture) bone in the smallest of the two bones in your lower leg, located on the outside of your leg (fibula) close to the area at your ankle joint. CAUSES  Rolling your ankle.  Twisting your ankle.  Extreme flexing or extending of your foot.  Severe force on your ankle as when falling from a distance. RISK FACTORS  Jumping activities.  Participation in sports.  Osteoporosis.  Advanced age.  Previous ankle injuries. SIGNS AND  SYMPTOMS  Pain.  Swelling.  Inability to put weight on injured ankle.  Bruising.  Bone deformities at site of injury. DIAGNOSIS  This fracture is diagnosed with the help of an X-ray exam. TREATMENT  If the fractured bone did not move out of place it usually will heal without problems and does casting or splinting. If immobilization is needed for comfort or the fractured bone moved out of place and will not heal properly with immobilization, a cast or splint will be used. HOME CARE INSTRUCTIONS   Apply ice to the area of injury:  Put ice in a plastic bag.  Place a towel between your skin and the bag.  Leave the ice on for 20 minutes, 2-3 times a day.  Use crutches as directed. Resume walking without crutches as directed by your health care provider.  Only take over-the-counter or prescription medicines for pain, discomfort, or fever as directed by your health care provider.  If you have a removable splint or boot, do not remove the boot unless directed by your health care provider. SEEK MEDICAL CARE IF:   You have continued pain or more swelling  The medications do not control the pain. SEEK IMMEDIATE MEDICAL CARE IF:  You develop severe pain in the leg or foot.  Your skin or nails below the injury turn blue or grey or feel cold or numb. MAKE SURE YOU:   Understand these instructions.  Will watch your condition.  Will get help right away if you are not doing well or get worse. Document Released: 05/10/2005 Document Revised: 02/28/2013 Document Reviewed: 12/20/2012 [facility] Patient Information 2015 [place], [place]. This information is not intended to replace advice given to you by your health care provider. Make sure you discuss any questions you have with your health care provider.

## 2015-01-06 NOTE — ED Notes (Signed)
Pt stepped of curb, inj to left ankle  Increased swelling

## 2015-01-06 NOTE — ED Notes (Signed)
She stepped off a curb and her foot gave out. She fell. Left foot injury.

## 2015-01-06 NOTE — ED Provider Notes (Signed)
CSN: [id number]     Arrival date & time 01/06/15  1920 History   First MD Initiated Contact with Patient 01/06/15 1921     Chief Complaint  Patient presents with  . Fall  . Foot Injury     (Consider location/radiation/quality/duration/timing/severity/associated sxs/prior Treatment) HPI Comments: 34 year old female complaining of left ankle pain after accidentally twisting her ankle on the curb this afternoon, about 4 hours ago. States she went home to see if the pain would go away, noticed it started swelling and she had severe, 10/10 pain with walking and flexing her ankle with pain radiating up the side of her leg. Pain alleviated by rest. No medications taken. No numbness or tingling. She broke this foot last year but did not follow up.  Patient is a 34 y.o. female presenting with fall and foot injury. The history is provided by the patient.  Fall  Foot Injury   Past Medical History  Diagnosis Date  . Preterm labor   . Vaginal Pap smear, abnormal     fine since LEEP  . Complication of anesthesia itching  . Anxiety     no meds  . Anemia   . S/P cesarean section 10/22/2014  . S/P tubal ligation 10/22/2014   Past Surgical History  Procedure Laterality Date  . Cesarean section      x 4, only 3 living - all delivered in [place]  . Cervical biopsy  w/ loop electrode excision    . Therapeutic abortion    . Cesarean section with bilateral tubal ligation Bilateral 10/22/2014    Procedure: REPEAT CESAREAN SECTION WITH BILATERAL TUBAL LIGATION;  Surgeon: Sherian Rein, MD;  Location: [facility];  Service: Obstetrics;  Laterality: Bilateral;  5th cesarean Dr. Jackelyn Knife assisting No RNFA needed per Dr. Ellyn Hack   Family History  Problem Relation Age of Onset  . Hypertension Mother   . Asthma Father   . Heart disease Son   . Cancer Paternal Aunt     cervical   Social History  Substance Use Topics  . Smoking status: Never Smoker   . Smokeless tobacco: Never Used  . Alcohol Use: No    OB History    Gravida Para Term Preterm AB TAB SAB Ectopic Multiple Living   0 4     Review of Systems  Musculoskeletal:       + L ankle pain and swelling.  All other systems reviewed and are negative.     Allergies  Amoxicillin; Erythromycin; and Penicillins  Home Medications   Prior to Admission medications   Medication Sig Start Date End Date Taking? Authorizing Provider  acetaminophen (TYLENOL) 500 MG tablet Take 1,000 mg by mouth every 6 (six) hours as needed for moderate pain.    Historical Provider, MD  diphenhydrAMINE (BENADRYL) 25 MG tablet Take 25 mg by mouth daily as needed for allergies.     Historical Provider, MD  flintstones complete (FLINTSTONES) 60 MG chewable tablet Chew 2 tablets by mouth daily.     Historical Provider, MD  ibuprofen (ADVIL,MOTRIN) 800 MG tablet Take 1 tablet (800 mg total) by mouth 3 (three) times daily. 01/06/15   Kathrynn Speed, PA-C  oxyCODONE-acetaminophen (PERCOCET/ROXICET) 5-325 MG per tablet Take 1-2 tablets by mouth every 6 (six) hours as needed for severe pain. 10/24/14   Sherian Rein, MD  phenylephrine-shark liver oil-mineral oil-petrolatum (PREPARATION H) 0.25-3-14-71.9 % rectal ointment Place 1 application rectally 2 (two) times daily as needed  for hemorrhoids.    Historical Provider, MD   BP 128/71 mmHg  Pulse 87  Temp(Src) 98.2 F (36.8 C) (Oral)  Resp 18  Ht 5\' 2"  (1.575 m)  Wt 190 lb (86.183 kg)  BMI 34.74 kg/m2  SpO2 100% Physical Exam  Constitutional: She is oriented to person, place, and time. She appears well-developed and well-nourished. No distress.  HENT:  Head: Normocephalic and atraumatic.  Mouth/Throat: Oropharynx is clear and moist.  Eyes: Conjunctivae and EOM are normal.  Neck: Normal range of motion. Neck supple.  Cardiovascular: Normal rate, regular rhythm and normal heart sounds.   Pulmonary/Chest: Effort normal and breath sounds normal. No respiratory distress.  Musculoskeletal:  L  ankle TTP laterally over lateral malleolus, CF ligament and AI TFL. Achilles tendon normal. No tenderness to proximal fibula. +2 PT/DP pulses. Range of motion limited due to pain into ankle.  Neurological: She is alert and oriented to person, place, and time. No sensory deficit.  Skin: Skin is warm and dry.  Psychiatric: She has a normal mood and affect. Her behavior is normal.  Nursing note and vitals reviewed.   ED Course  Procedures (including critical care time) Labs Review Labs Reviewed - No data to display  Imaging Review Dg Ankle Complete Left  01/06/2015   CLINICAL DATA:  Left foot and ankle injury while coming down steps with lateral pain and swelling.  EXAM: LEFT ANKLE COMPLETE - 3+ VIEW  COMPARISON:  None.  FINDINGS: Exam demonstrates soft tissue swelling laterally. Ankle mortise is normal. Exam demonstrates a minimally displaced fracture involving the distal tip of the fibula. Remainder of the exam is within normal.  IMPRESSION: Minimally displaced fracture of the distal tip of the fibula.   Electronically Signed   By: Elberta Fortis M.D.   On: 01/06/2015 20:16   Dg Foot Complete Left  01/06/2015   CLINICAL DATA:  Pt states her left foot gave out while walking down a step at [place] today. C/o lateral pain with swelling. States previous fx to anterior portion appox 1 year ago.  EXAM: LEFT FOOT - COMPLETE 3+ VIEW  COMPARISON:  None.  FINDINGS: There is no evidence of fracture or dislocation. There is no evidence of arthropathy or other focal bone abnormality. Soft tissues are unremarkable.  IMPRESSION: Negative.   Electronically Signed   By: Amie Portland M.D.   On: 01/06/2015 19:45   I, Celene Skeen, personally reviewed and evaluated these images and lab results as part of my medical decision-making.   EKG Interpretation None      MDM   Final diagnoses:  Fracture of distal end of left fibula   Neurovascularly intact. Cam Walker applied. Crutches given. Advised rice and  NSAIDs. Follow-up with Dr. Pearletha Forge. Stable for discharge. Return precautions given. Patient states understanding of treatment care plan and is agreeable.  Kathrynn Speed, PA-C 01/06/15 2047  Mirian Mo, MD 01/12/15 [phone]

## 2015-01-09 ENCOUNTER — Ambulatory Visit (INDEPENDENT_AMBULATORY_CARE_PROVIDER_SITE_OTHER): Payer: Medicaid Other | Admitting: Family Medicine

## 2015-01-09 ENCOUNTER — Encounter: Payer: Self-pay | Admitting: Family Medicine

## 2015-01-09 VITALS — BP 109/73 | Ht 62.0 in | Wt 190.0 lb

## 2015-01-09 DIAGNOSIS — S82402G Unspecified fracture of shaft of left fibula, subsequent encounter for closed fracture with delayed healing: Secondary | ICD-10-CM

## 2015-01-09 DIAGNOSIS — S82402A Unspecified fracture of shaft of left fibula, initial encounter for closed fracture: Secondary | ICD-10-CM | POA: Insufficient documentation

## 2015-01-09 NOTE — Patient Instructions (Signed)
You have an avulsion fracture of your fibula. These take about 6-8 weeks to completely recover. Ice the area for 15 minutes at a time, 3-4 times a day Aleve 2 tabs twice a day with food OR ibuprofen 600 or  three times a day with food for pain and inflammation. Elevate above the level of your heart when possible Crutches if needed to help with walking Bear weight when tolerated Use Cam walker to help with stability while you recover from this injury. Come out of the boot twice a day to do Up/down and alphabet exercises 2-3 sets of each. Consider physical therapy for strengthening and balance exercises in the future. If not improving as expected, we may repeat x-rays or consider further testing like an MRI. Follow up with me in 2 weeks.

## 2015-01-14 DIAGNOSIS — S99912A Unspecified injury of left ankle, initial encounter: Secondary | ICD-10-CM | POA: Insufficient documentation

## 2015-01-14 NOTE — Progress Notes (Signed)
PCP: MARSHALL,BERNARD A, MD  Subjective:   HPI: Patient is a 34 y.o. female here for left ankle injury.  Patient reports on 8/15 she accidentally stepped off a curb and inverted left ankle. Had prior injury, instability in this ankle as well. Couldn't bear weight initially following injury. Placed in posterior and stirrup splints, referred here Diagnosed with avulsion fracture of distal fibula.  Past Medical History  Diagnosis Date  . Preterm labor   . Vaginal Pap smear, abnormal     fine since LEEP  . Complication of anesthesia itching  . Anxiety     no meds  . Anemia   . S/P cesarean section 10/22/2014  . S/P tubal ligation 10/22/2014    Current Outpatient Prescriptions on File Prior to Visit  Medication Sig Dispense Refill  . acetaminophen (TYLENOL) 500 MG tablet Take 1,000 mg by mouth every 6 (six) hours as needed for moderate pain.    . diphenhydrAMINE (BENADRYL) 25 MG tablet Take 25 mg by mouth daily as needed for allergies.     . flintstones complete (FLINTSTONES) 60 MG chewable tablet Chew 2 tablets by mouth daily.     Marland Kitchen ibuprofen (ADVIL,MOTRIN) 800 MG tablet Take 1 tablet (800 mg total) by mouth 3 (three) times daily. 20 tablet 0  . oxyCODONE-acetaminophen (PERCOCET/ROXICET) 5-325 MG per tablet Take 1-2 tablets by mouth every 6 (six) hours as needed for severe pain. 40 tablet 0  . phenylephrine-shark liver oil-mineral oil-petrolatum (PREPARATION H) 0.25-3-14-71.9 % rectal ointment Place 1 application rectally 2 (two) times daily as needed for hemorrhoids.     No current facility-administered medications on file prior to visit.    Past Surgical History  Procedure Laterality Date  . Cesarean section      x 4, only 3 living - all delivered in [place]  . Cervical biopsy  w/ loop electrode excision    . Therapeutic abortion    . Cesarean section with bilateral tubal ligation Bilateral 10/22/2014    Procedure: REPEAT CESAREAN SECTION WITH BILATERAL TUBAL LIGATION;  Surgeon:  Sherian Rein, MD;  Location: [facility];  Service: Obstetrics;  Laterality: Bilateral;  5th cesarean Dr. Jackelyn Knife assisting No RNFA needed per Dr. Ellyn Hack    Allergies  Allergen Reactions  . Amoxicillin Rash  . Erythromycin Rash  . Penicillins Rash    Social History   Social History  . Marital Status: Married    Spouse Name: N/A  . Number of Children: N/A  . Years of Education: N/A   Occupational History  . Not on file.   Social History Main Topics  . Smoking status: Never Smoker   . Smokeless tobacco: Never Used  . Alcohol Use: No  . Drug Use: No  . Sexual Activity: Yes    Birth Control/ Protection: None     Comment: pregnant   Other Topics Concern  . Not on file   Social History Narrative    Family History  Problem Relation Age of Onset  . Hypertension Mother   . Asthma Father   . Heart disease Son   . Cancer Paternal Aunt     cervical    BP 109/73 mmHg  Ht 5\' 2"  (1.575 m)  Wt 190 lb (86.183 kg)  BMI 34.74 kg/m2  Review of Systems: See HPI above.    Objective:  Physical Exam:  Gen: NAD  Left ankle: Mod swelling, bruising mainly laterally. Mod limitation ROM all directions but able to do so. TTP distal fibula, over ATFL.  No  medial, other tenderness. Deferred ant drawer and talar tilt.   Painful syndesmotic compression. Thompsons test negative. NV intact distally.    Assessment & Plan:  1. Left distal fibula avulsion fracture - expect 6-8 weeks to recover.  Below level of ankle joint, good prognosis.  Icing, elevation, nsaids.  Cam walker when up and walking around.  Script written for a knee scooter.  Can do simple ROM exercises twice a day as well.  F/u in 2 weeks.

## 2015-01-14 NOTE — Assessment & Plan Note (Signed)
Left distal fibula avulsion fracture - expect 6-8 weeks to recover.  Below level of ankle joint, good prognosis.  Icing, elevation, nsaids.  Cam walker when up and walking around.  Script written for a knee scooter.  Can do simple ROM exercises twice a day as well.  F/u in 2 weeks.

## 2015-01-24 ENCOUNTER — Ambulatory Visit: Payer: Medicaid Other | Admitting: Family Medicine

## 2015-09-12 IMAGING — CR DG FOOT COMPLETE 3+V*L*
3 series · 3 of 3 positions shown · non-contrast
Comparison: None.

CLINICAL DATA: Foot injury].  Swelling and abrasion across dorsal
foot.

LEFT FOOT - COMPLETE 3+ VIEW

[t foot ap left]
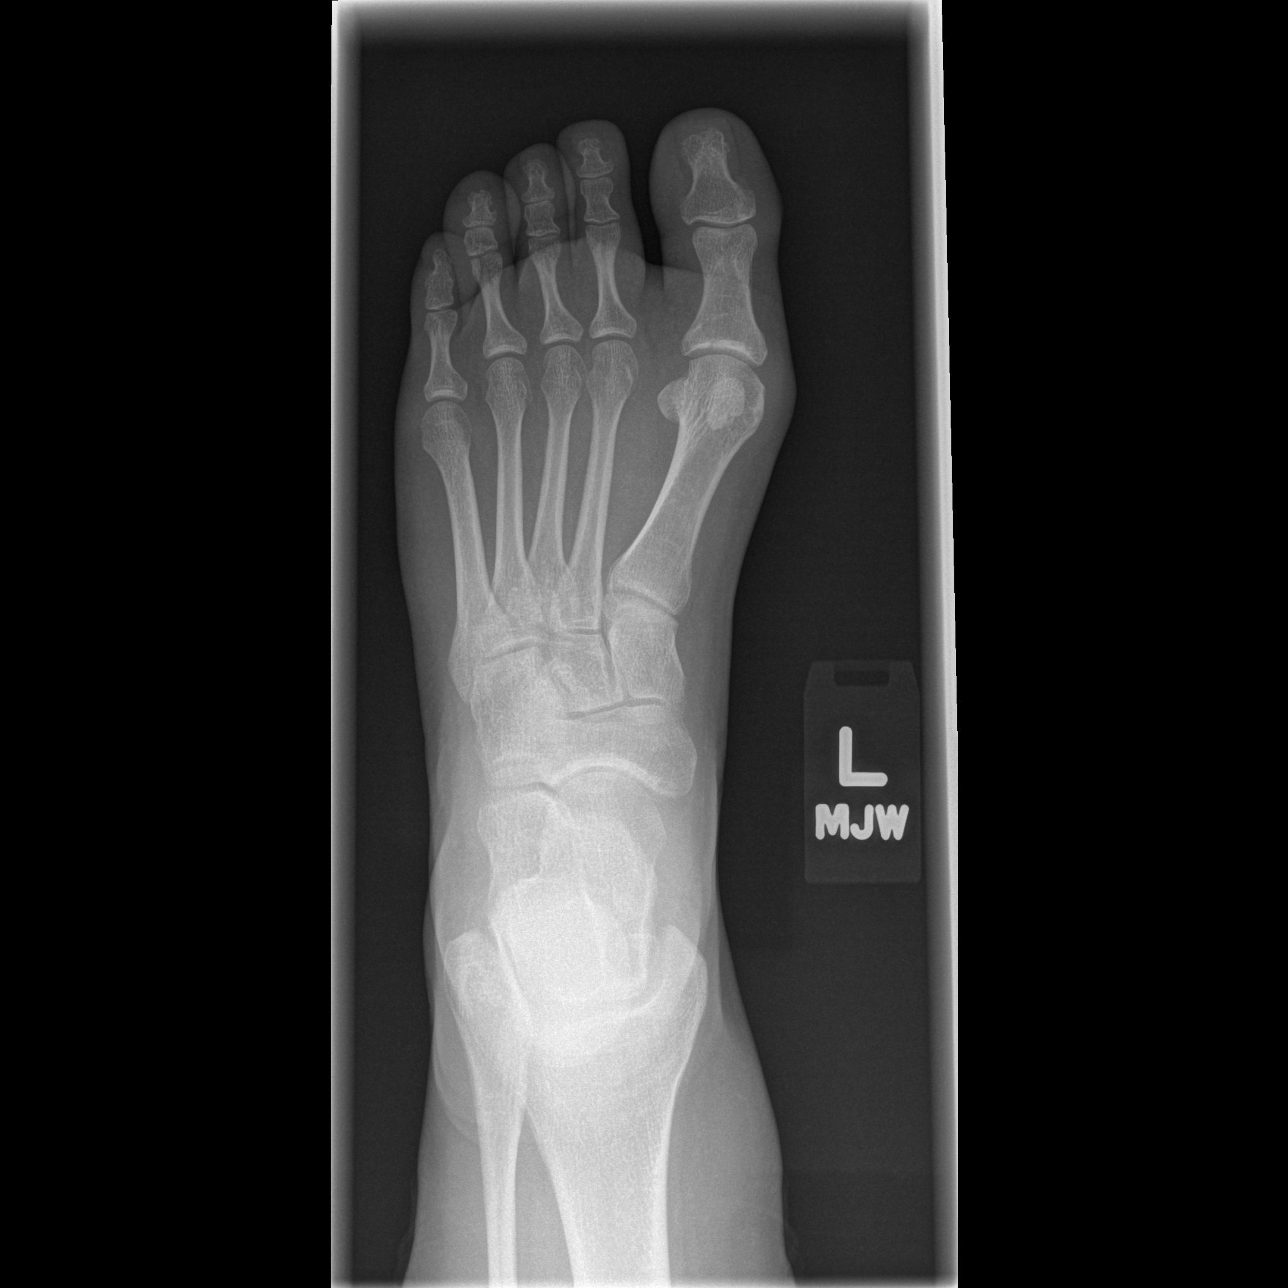

[t foot oblique left]
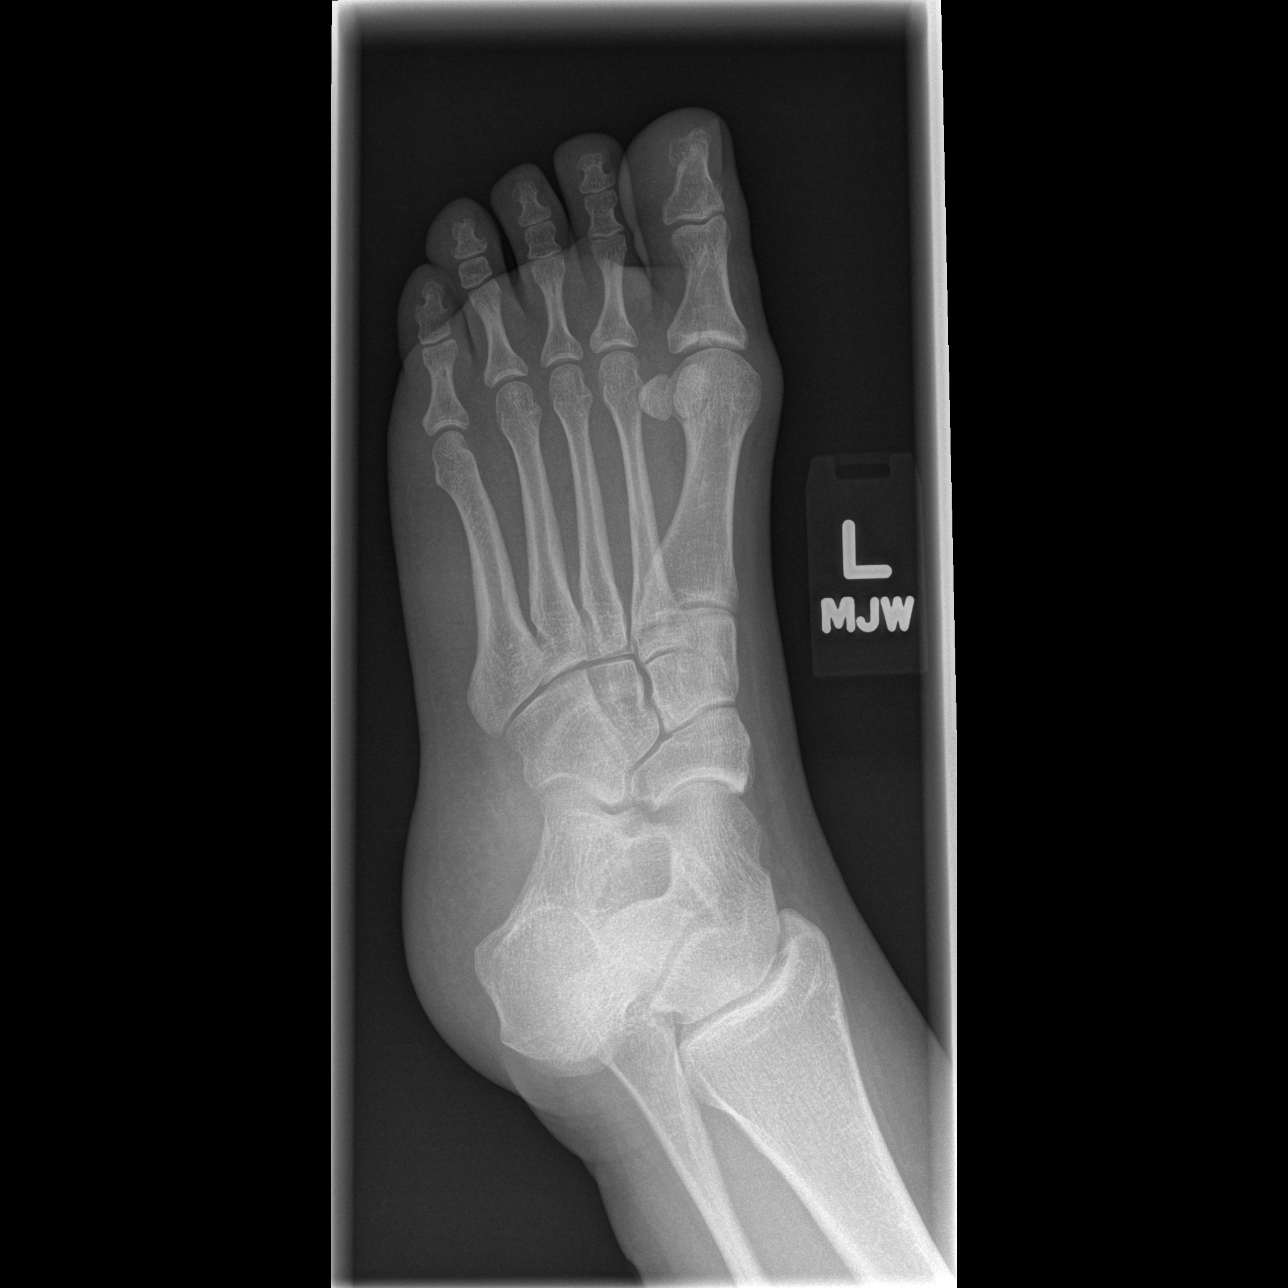

[t foot lat left]
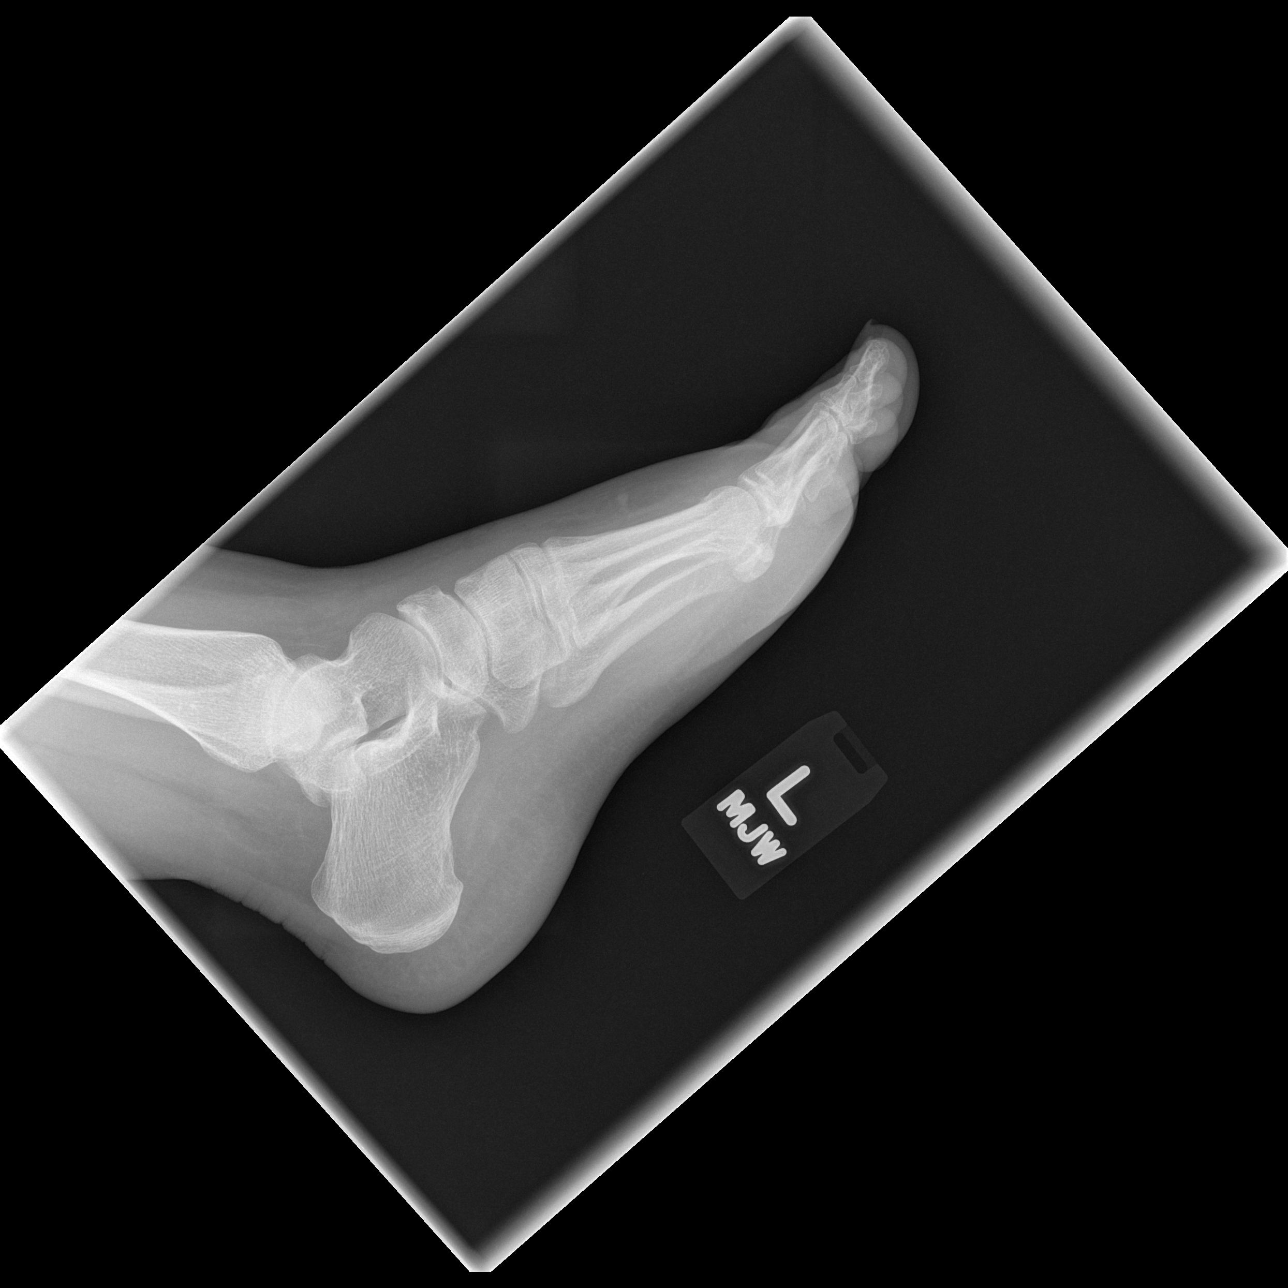

[3 of 3 positions shown; findings below may reference images not displayed]

FINDINGS: Normal bony mineralization.  The joints are aligned.  No
acute or healing fracture, focal bony abnormality or focal joint
space abnormality is seen.  There is soft tissue swelling over the
dorsum of the distal foot.
IMPRESSION: Dorsal soft tissue swelling.  No acute bony abnormality.

## 2015-10-24 IMAGING — CR DG CHEST 2V
2 series · 2 of 2 positions shown · non-contrast
Comparison: None.

CLINICAL DATA: Chest pain

EXAM:
CHEST  2 VIEW

[w chest pa]
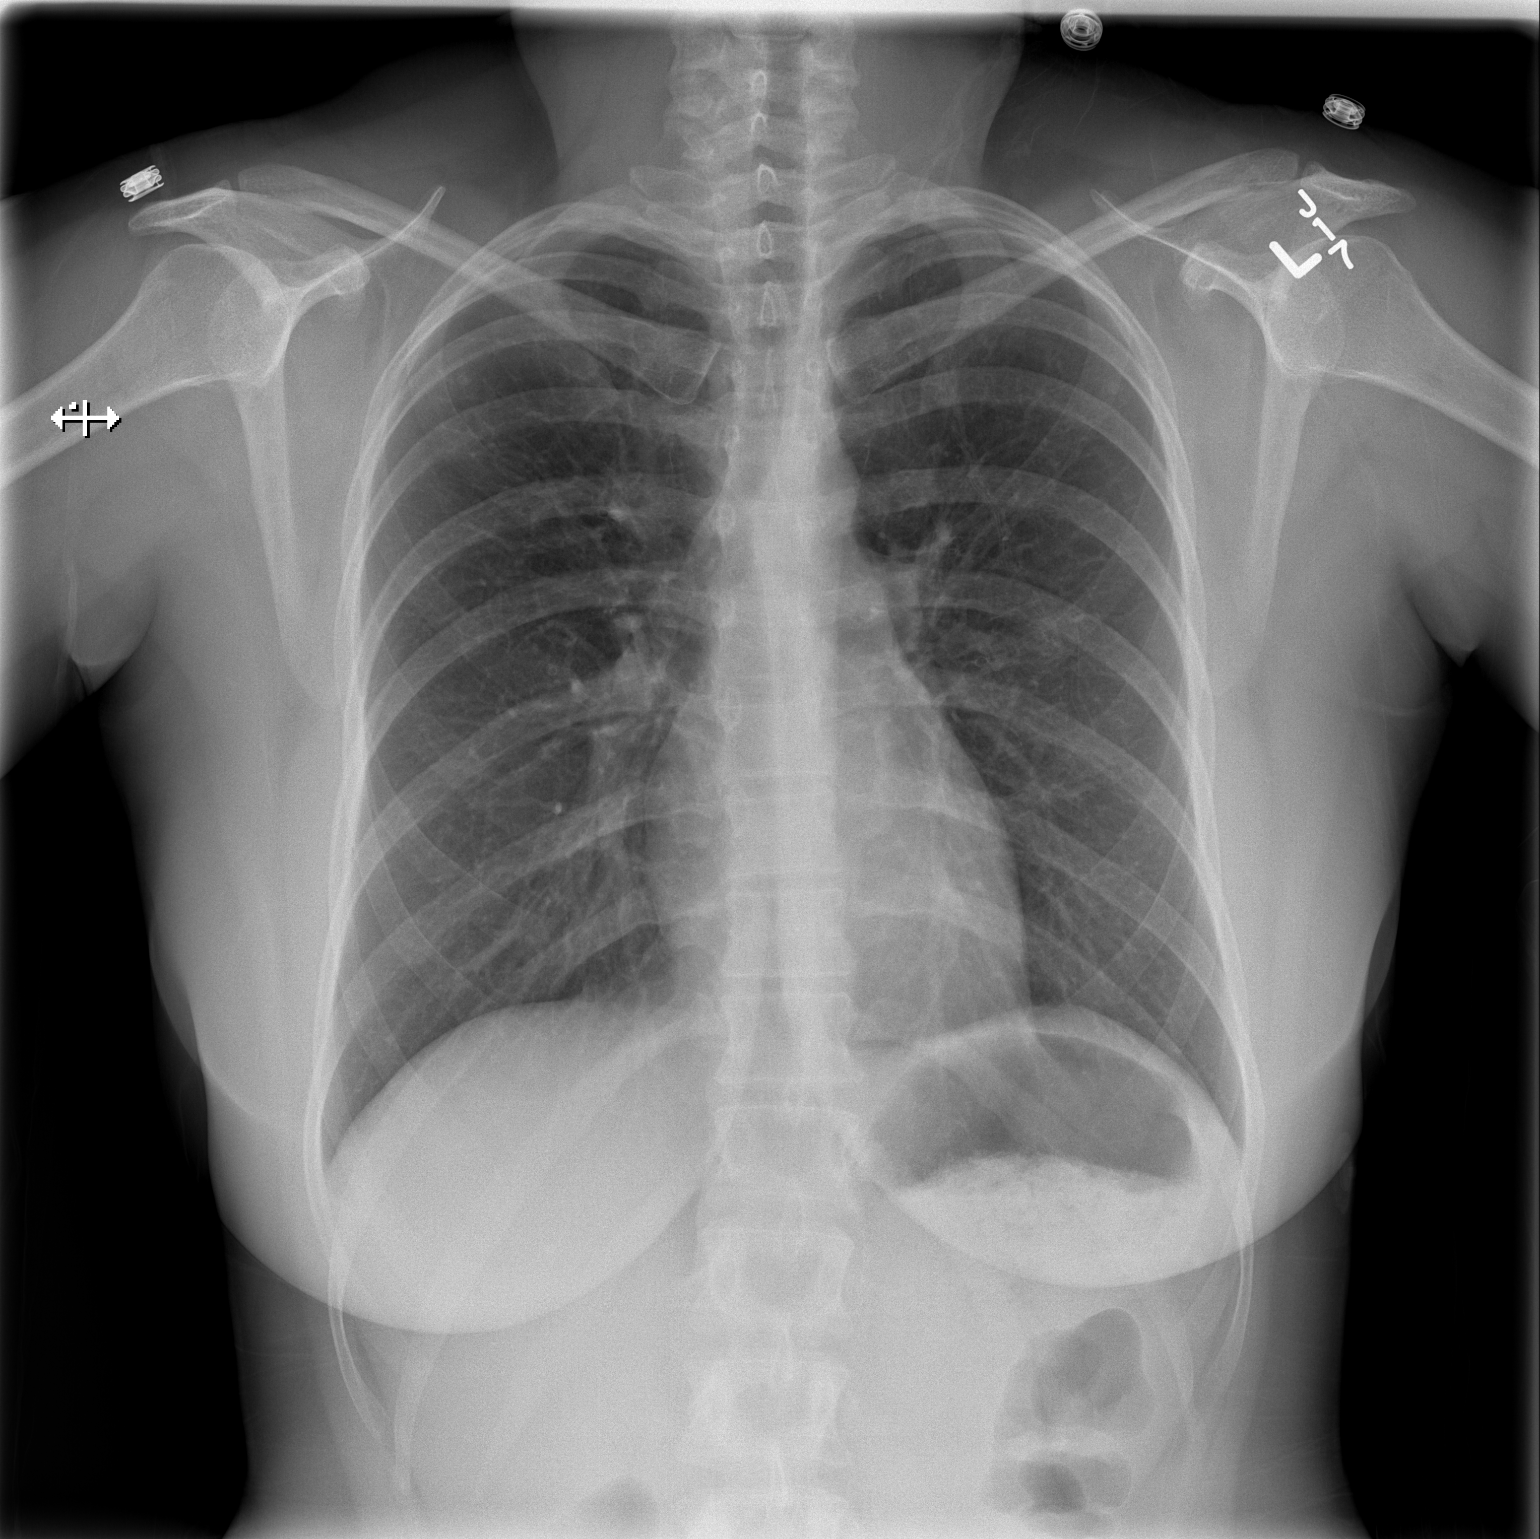

[w chest lat]
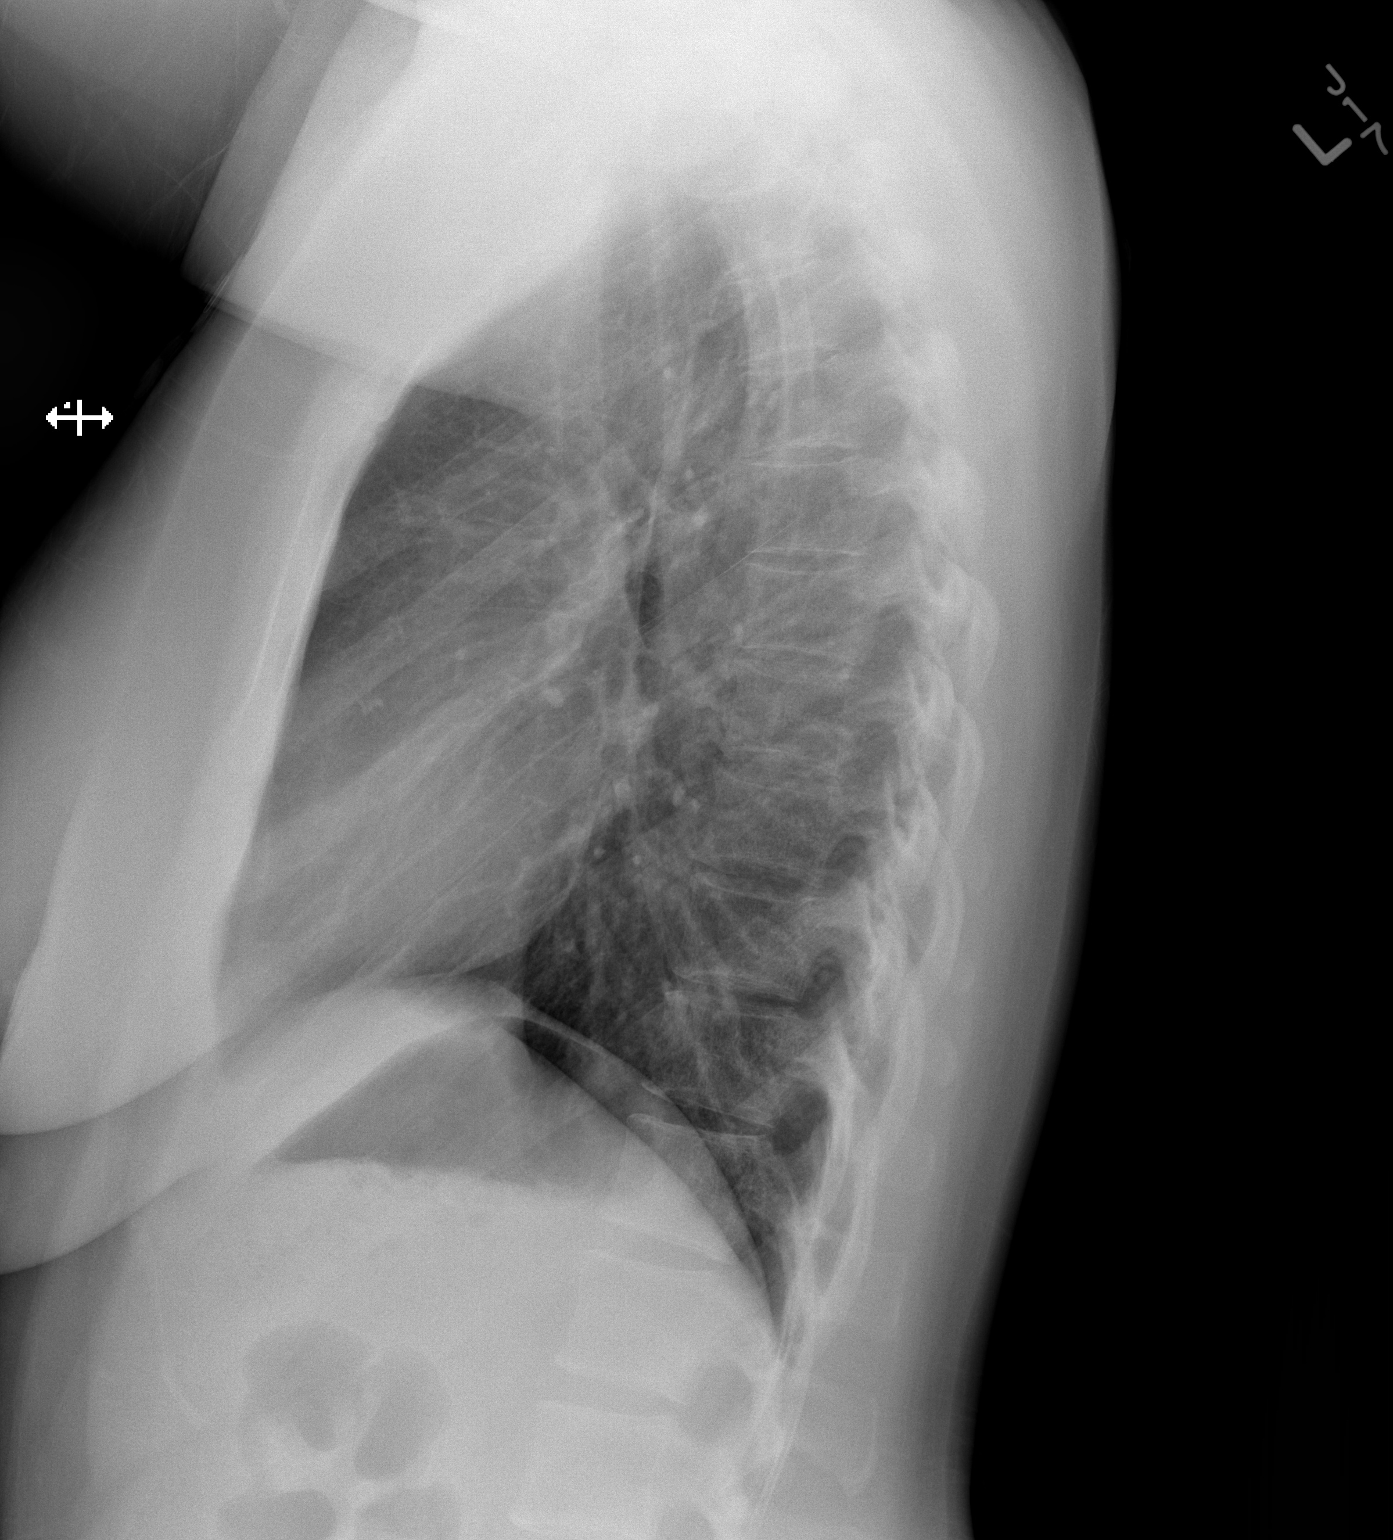

[2 of 2 positions shown; findings below may reference images not displayed]

FINDINGS: The lungs are clear. Heart size and pulmonary vascularity are
normal. No adenopathy. No pneumothorax. No bone lesions. There is
mild lower thoracic levoscoliosis.
IMPRESSION: No edema or consolidation.
